# Patient Record
Sex: Male | Born: 1955 | Race: Black or African American | Hispanic: No | Marital: Married | Smoking: Never smoker
Health system: Southern US, Community
[De-identification: ages and names within clinical notes are randomized; demographics above are authoritative.]

## PROBLEM LIST (undated history)

## (undated) ENCOUNTER — Emergency Department (HOSPITAL_COMMUNITY): Admission: EM | Payer: BC Managed Care – PPO

## (undated) DIAGNOSIS — T7840XA Allergy, unspecified, initial encounter: Secondary | ICD-10-CM

## (undated) DIAGNOSIS — Z789 Other specified health status: Secondary | ICD-10-CM

## (undated) DIAGNOSIS — I10 Essential (primary) hypertension: Secondary | ICD-10-CM

## (undated) DIAGNOSIS — Q5520 Unspecified congenital malformations of testis and scrotum: Secondary | ICD-10-CM

## (undated) DIAGNOSIS — N529 Male erectile dysfunction, unspecified: Secondary | ICD-10-CM

## (undated) HISTORY — DX: Essential (primary) hypertension: I10

## (undated) HISTORY — DX: Unspecified congenital malformations of testis and scrotum: Q55.20

## (undated) HISTORY — PX: PICC LINE INSERTION: CATH118290

## (undated) HISTORY — DX: Allergy, unspecified, initial encounter: T78.40XA

## (undated) HISTORY — DX: Male erectile dysfunction, unspecified: N52.9

## (undated) HISTORY — PX: TONSILLECTOMY: SUR1361

---

## 2001-09-11 ENCOUNTER — Encounter: Payer: Self-pay | Admitting: Emergency Medicine

## 2001-09-11 ENCOUNTER — Inpatient Hospital Stay (HOSPITAL_COMMUNITY): Admission: EM | Admit: 2001-09-11 | Discharge: 2001-09-12 | Payer: Self-pay | Admitting: Emergency Medicine

## 2002-04-02 ENCOUNTER — Encounter: Payer: Self-pay | Admitting: Family Medicine

## 2002-04-02 ENCOUNTER — Encounter: Admission: RE | Admit: 2002-04-02 | Discharge: 2002-04-02 | Payer: Self-pay | Admitting: Family Medicine

## 2006-04-24 ENCOUNTER — Ambulatory Visit: Payer: Self-pay | Admitting: Family Medicine

## 2006-05-17 ENCOUNTER — Ambulatory Visit: Payer: Self-pay | Admitting: Gastroenterology

## 2008-06-24 ENCOUNTER — Ambulatory Visit: Payer: Self-pay | Admitting: Family Medicine

## 2009-08-28 ENCOUNTER — Ambulatory Visit: Payer: Self-pay | Admitting: Family Medicine

## 2009-09-14 ENCOUNTER — Ambulatory Visit: Payer: Self-pay | Admitting: Gastroenterology

## 2010-07-29 NOTE — Miscellaneous (Signed)
Summary: [facility] Previsit cx'd/pt.  Clinical Lists Changes     Pt. didn't know when he could get off work for his procedure and didn't know if his wife could get off work to bring him.  In the past he has canceled multiple procedures due to his work schedule.  I advised him to coordinate with his wife and his work so he'd be cleared for  a Friday(Pt. is off every Friday).  Then call and set up his previsit and procedure day.  Pt. verbalized understanding.

## 2010-11-12 NOTE — Op Note (Signed)
[place]. [facility]  Patient:    Aaron Leonard Visit Number: [id number] MRN: [id number]          Service Type: SUR Location: 5000 [id number] [id number] Attending Physician:  Patricia Nettle Dictated by:   Patricia Nettle, M.D. Proc. Date: 09/11/01 Admit Date:  09/11/2001                             Operative Report  DATE OF BIRTH:  10/31/1955  DIAGNOSES: 1. Traumatic left knee arthrotomy. 2. Complete left medial collateral ligament laceration. 3. Grade I open medial femoral condyle fracture.  PROCEDURE: 1. Irrigation and debridement of left open knee arthrotomy and open medial    femoral condyle fracture. 2. Open reduction internal fixation of left medial femoral condyle fracture. 3. Repair of medial collateral ligament laceration. 4. Closure of traumatic wound left knee.  SURGEON:  Patricia Nettle, M.D.  ANESTHESIA:  General endotracheal.  COMPLICATIONS:  None.  INDICATIONS FOR PROCEDURE:  The patient is a 55 year old male who was in his normal state of good health until approximately 2:30 a.m. 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  He suffered a severe laceration with a piece of sheet metal on the conveyor belt, and presented to the [facility] Emergency Department for treatment.  X-rays showed fracture of the medial femoral condyle with air in the joint.  On examination, there was a large 15 cm laceration about the medial joint line.  He had a CT scan which again confirmed the fracture, as well as air in the joint.  He was taken to the operating room for urgent irrigation and debridement and exploration of the wound.  DESCRIPTION OF PROCEDURE:  The patient was properly identified in the holding area as Georgie Chard, and taken to the operating room.  He underwent general endotracheal anesthesia without difficulty.  He was given prophylactic IV antibiotics in the form of Ancef.  He had  previously received a tetanus shot in the emergency department.  The left lower extremity was prepped and draped in the usual sterile fashion.  The laceration was examined.  There was a complete cut through the medial collateral ligament with gross instability to valgus stress testing.  The patella retinaculum had also been lacerated, but the patella itself was not injured.  There was a transverse cut through the medial femoral condyle in a horizontal oblique fashion approximately 1 cm proximal to the articulating articular cartilage.  It was through the non-weightbearing portion of the cartilaginous surface of the medial femoral condyle.  This had created a flap of bone that was displaced proximally, although had a fixed base.  There was no gross contamination noted.  The cuts were cleaned.  At this point, the wound was irrigated with 9 L of sterile saline and 500 cc of antibiotic impregnated solution.  This was done in a pulse lavage fashion, and I was able to look directly into the joint and irrigate all the way across, including the medial, lateral, and suprapatellar compartments.  The medial meniscus was directly visualized and found to be intact.  The weightbearing surface of the medial femoral condyle was intact. The fracture was displaced approximately 1 cm, and I did not feel that the fragment was large enough to accept a screw.  Therefore, I performed a direct repair with #1 PDS suture closing  down the gap.  After reducing and suturing the fracture, we then did a direct repair of the MCL using the #1 interrupted PDS suture in a figure-of-eight fashion.  The patellar retinaculum was then closed with #1 PDS suture.  The deep layers of the subcutaneous tissues were closed using #1 interrupted Vicryl, followed by 2-0 interrupted Vicryl on the superficial subcutaneous layer, and then 3-0 nylon sutures on the skin.  A sterile dressing was applied.  The patient was placed into a knee  immobilizer, extubated without difficulty, and transferred to the recovery room in stable condition. Dictated by:   Patricia Nettle, M.D. Attending Physician:  Patricia Nettle. DD:  09/11/01 TD:  09/12/01 Job: [id number]

## 2010-11-12 NOTE — H&P (Signed)
[place]  Patient:    Aaron Leonard, Aaron Leonard Visit Number: [id number] MRN: [id number]          Service Type: SUR Location: 5000 5003 01 Attending Physician:  Patricia Nettle Dictated by:   Marcie Bal Troncale, P.A.C. Admit Date:  09/11/2001                           History and Physical  DATE OF BIRTH:  [birth year]  CHIEF COMPLAINT:  Left knee injury.  HISTORY OF PRESENT ILLNESS:  The patient is a 55 year old male who sustained an injury to his left knee earlier this morning while at work.  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  While unloading some boxes early this morning, he turned to get a better position and in doing so, his knee got caught on the conveyer belt, twisted and sustained a laceration and injury to his medial aspect of his left knee.  He had no other associated injuries.  He had immediate pain and bleeding from the knee.  He was transported to the [facility] emergency department for further evaluation and treatment.  Dr. Patricia Nettle in orthopedics was consulted.  Patients last p.o. intake was at 12:15 a.m. this morning.  His tetanus shot was updated as well in the emergency department tonight.  REVIEW OF SYSTEMS:  He denies any recent fever or chills.  No headaches, blurry vision or diplopia.  Reports some rhinorrhea secondary to sinus allergies.  No earache or sore throat.  No chest pain, shortness of breath or cough.  No abdominal pain, nausea, vomiting, diarrhea or constipation.  No melena or bright red blood per rectum.  No urinary frequency, hematuria or dysuria.  No numbness or tingling in his extremities.  PAST MEDICAL HISTORY:  He reports good health.  He denies diabetes, hypertension, heart, kidney or ulcer disease.  PAST SURGICAL HISTORY:  Tonsillectomy as a child.  ALLERGIES:  No known drug allergies.  CURRENT MEDICATIONS:  None.  SOCIAL HISTORY:  He works as a Writer.  He is  married and has one child as well as a stepchild.  He denies tobacco or alcohol use. Dr. Meredith Staggers is his primary medical physician, who he saw about two years ago for a routine physical.  PHYSICAL EXAMINATION:  VITALS:  Please see ER record.  GENERAL:  This is a well-developed, well-nourished male in no acute distress.  HEENT:  Head is atraumatic, normocephalic.  Pupils are equal, round and reactive to light and extraocular movements are grossly intact.  Oropharynx is clear without redness, exudate or lesions.  NECK:  Supple with no cervical lymphadenopathy.  CHEST:  Clear to auscultation bilaterally with no wheezes or crackles.  HEART:  Regular rate and rhythm with no murmurs, rubs or gallops.  ABDOMEN:  Soft, nontender, nondistended with no masses.  EXTREMITIES:  Left knee is currently wrapped.  Laceration will be explored in the operating room as well as a clinical exam of the ligamentous structures to the knee.  His distal neurovascular exam is intact, 2+ dorsalis pedis pulse. Motor and sensory are grossly intact in the foot and toes and ankle.  RADIOGRAPHIC STUDIES:  X-rays of the left knee demonstrate a non-displaced medial femoral condyle fracture.  CT scan is currently pending.  IMPRESSION:  Open left knee medial femoral condyle fracture.  PLAN:  Patient will be admitted, kept n.p.o. and  brought to the operating room for irrigation, debridement and exploration of the left knee by Dr. Noel Gerold this morning.  All questions have been encouraged and answered for the patient. Dictated by:   Marcie Bal Troncale, P.A.C. Attending Physician:  Patricia Nettle. DD:  09/11/01 TD:  09/12/01 Job: [phone] [id number]

## 2011-05-17 ENCOUNTER — Encounter: Payer: Self-pay | Admitting: Family Medicine

## 2011-05-23 ENCOUNTER — Encounter: Payer: Self-pay | Admitting: Family Medicine

## 2011-06-06 ENCOUNTER — Encounter: Payer: Self-pay | Admitting: Family Medicine

## 2011-06-06 ENCOUNTER — Ambulatory Visit (INDEPENDENT_AMBULATORY_CARE_PROVIDER_SITE_OTHER): Payer: Managed Care, Other (non HMO) | Admitting: Family Medicine

## 2011-06-06 DIAGNOSIS — N529 Male erectile dysfunction, unspecified: Secondary | ICD-10-CM

## 2011-06-06 DIAGNOSIS — J302 Other seasonal allergic rhinitis: Secondary | ICD-10-CM

## 2011-06-06 DIAGNOSIS — J309 Allergic rhinitis, unspecified: Secondary | ICD-10-CM

## 2011-06-06 DIAGNOSIS — Z0000 Encounter for general adult medical examination without abnormal findings: Secondary | ICD-10-CM

## 2011-06-06 MED ORDER — VARDENAFIL HCL 20 MG PO TABS: 20.0000 mg | ORAL_TABLET | Freq: Every day | ORAL | Status: DC | PRN

## 2011-06-06 NOTE — Patient Instructions (Signed)
Continue to take good care of yourself 

## 2011-06-06 NOTE — Progress Notes (Signed)
  Subjective:    Patient ID: Aaron Leonard, male    DOB: April 28, 1956, 55 y.o.   MRN: [id number]  HPI He is here for complete examination. He is doing quite well. He does have seasonal allergies again. Little difficulty. He would like a refill on his Levitra which she states works quite well. He has no other concerns or complaints. 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 He has never had an exploration of this.   Review of Systems  HENT: Negative.   Eyes: Negative.   Respiratory: Negative.   Cardiovascular: Negative.   Gastrointestinal: Negative.   Genitourinary: Negative.   Musculoskeletal: Negative.   Skin: Negative.   Neurological: Negative.   Hematological: Negative.   Psychiatric/Behavioral: Negative.        Objective:   Physical Exam BP 130/90  Pulse 83  Ht 5\' 9"  (1.753 m)  Wt 186 lb (84.369 kg)  BMI 27.47 kg/m2  General Appearance:    Alert, cooperative, no distress, appears stated age  Head:    Normocephalic, without obvious abnormality, atraumatic  Eyes:    PERRL, conjunctiva/corneas clear, EOM's intact, fundi    benign  Ears:    Normal TM's and external ear canals  Nose:   Nares normal, mucosa normal, no drainage or sinus   tenderness  Throat:   Lips, mucosa, and tongue normal; teeth and gums normal  Neck:   Supple, no lymphadenopathy;  thyroid:  no   enlargement/tenderness/nodules; no carotid   bruit or JVD  Back:    Spine nontender, no curvature, ROM normal, no CVA     tenderness  Lungs:     Clear to auscultation bilaterally without wheezes, rales or     ronchi; respirations unlabored  Chest Wall:    No tenderness or deformity   Heart:    Regular rate and rhythm, S1 and S2 normal, no murmur, rub   or gallop  Breast Exam:    No chest wall tenderness, masses or gynecomastia  Abdomen:     Soft, non-tender, nondistended, normoactive bowel sounds,    no masses, no hepatosplenomegaly  Genitalia:    Normal male external genitalia  without lesions.  Testicle without masses.  No inguinal hernias.  Rectal:   deferred.  Extremities:   No clubbing, cyanosis or edema  Pulses:   2+ and symmetric all extremities  Skin:   Skin color, texture, turgor normal, no rashes or lesions  Lymph nodes:   Cervical, supraclavicular, and axillary nodes normal  Neurologic:   CNII-XII intact, normal strength, sensation and gait; reflexes 2+ and symmetric throughout          Psych:   Normal mood, affect, hygiene and grooming.           Assessment & Plan:   1. Routine general medical examination at a health care facility  HM COLONOSCOPY, PSA, CBC with Differential, Comprehensive metabolic panel, Lipid panel  2. ED (erectile dysfunction)    3. Allergic rhinitis, seasonal    4 absence of one testing Flu shot given with discussion of possible side effects. Levitra was also renewed.

## 2011-06-07 DIAGNOSIS — Z23 Encounter for immunization: Secondary | ICD-10-CM

## 2011-06-07 LAB — CBC WITH DIFFERENTIAL/PLATELET
Basophils Absolute: 0 10*3/uL (ref 0.0–0.1)
Basophils Relative: 0 % (ref 0–1)
Eosinophils Absolute: 0.1 10*3/uL (ref 0.0–0.7)
Eosinophils Relative: 1 % (ref 0–5)
HCT: 46.7 % (ref 39.0–52.0)
Hemoglobin: 15.1 g/dL (ref 13.0–17.0)
Lymphocytes Relative: 32 % (ref 12–46)
Lymphs Abs: 2.7 10*3/uL (ref 0.7–4.0)
MCH: 28.7 pg (ref 26.0–34.0)
MCHC: 32.3 g/dL (ref 30.0–36.0)
MCV: 88.6 fL (ref 78.0–100.0)
Monocytes Absolute: 0.6 10*3/uL (ref 0.1–1.0)
Monocytes Relative: 7 % (ref 3–12)
Neutro Abs: 5.1 10*3/uL (ref 1.7–7.7)
Neutrophils Relative %: 60 % (ref 43–77)
Platelets: 322 10*3/uL (ref 150–400)
RBC: 5.27 MIL/uL (ref 4.22–5.81)
RDW: 13.2 % (ref 11.5–15.5)
WBC: 8.5 10*3/uL (ref 4.0–10.5)

## 2011-06-07 LAB — COMPREHENSIVE METABOLIC PANEL
ALT: 39 U/L (ref 0–53)
AST: 42 U/L — ABNORMAL HIGH (ref 0–37)
Albumin: 4.9 g/dL (ref 3.5–5.2)
Alkaline Phosphatase: 64 U/L (ref 39–117)
BUN: 17 mg/dL (ref 6–23)
CO2: 24 mEq/L (ref 19–32)
Calcium: 9.9 mg/dL (ref 8.4–10.5)
Chloride: 104 mEq/L (ref 96–112)
Creat: 0.74 mg/dL (ref 0.50–1.35)
Glucose, Bld: 79 mg/dL (ref 70–99)
Potassium: 4.2 mEq/L (ref 3.5–5.3)
Sodium: 141 mEq/L (ref 135–145)
Total Bilirubin: 0.8 mg/dL (ref 0.3–1.2)
Total Protein: 8 g/dL (ref 6.0–8.3)

## 2011-06-07 LAB — LIPID PANEL
HDL: 54 mg/dL (ref >39)
LDL Cholesterol: 62 mg/dL (ref 0–99)
Total CHOL/HDL Ratio: 2.3 Ratio
VLDL: 8 mg/dL (ref 0–40)

## 2011-06-07 LAB — PSA: PSA: 3.13 ng/mL (ref <=4.00)

## 2011-07-04 ENCOUNTER — Encounter: Payer: Managed Care, Other (non HMO) | Admitting: Gastroenterology

## 2013-06-07 ENCOUNTER — Encounter: Payer: Self-pay | Admitting: Family Medicine

## 2013-06-07 ENCOUNTER — Ambulatory Visit (INDEPENDENT_AMBULATORY_CARE_PROVIDER_SITE_OTHER): Payer: Managed Care, Other (non HMO) | Admitting: Family Medicine

## 2013-06-07 VITALS — BP 130/90 | HR 109 | Temp 99.0°F | Wt 180.0 lb

## 2013-06-07 DIAGNOSIS — R1011 Right upper quadrant pain: Secondary | ICD-10-CM

## 2013-06-07 LAB — CBC WITH DIFFERENTIAL/PLATELET
Basophils Absolute: 0 10*3/uL (ref 0.0–0.1)
Basophils Relative: 0 % (ref 0–1)
Lymphocytes Relative: 10 % — ABNORMAL LOW (ref 12–46)
MCHC: 33.8 g/dL (ref 30.0–36.0)
Neutro Abs: 14.1 10*3/uL — ABNORMAL HIGH (ref 1.7–7.7)
Neutrophils Relative %: 81 % — ABNORMAL HIGH (ref 43–77)
Platelets: 386 10*3/uL (ref 150–400)
RDW: 12.7 % (ref 11.5–15.5)
WBC: 17.4 10*3/uL — ABNORMAL HIGH (ref 4.0–10.5)

## 2013-06-07 LAB — COMPREHENSIVE METABOLIC PANEL
ALT: 41 U/L (ref 0–53)
AST: 44 U/L — ABNORMAL HIGH (ref 0–37)
Albumin: 3.6 g/dL (ref 3.5–5.2)
Alkaline Phosphatase: 107 U/L (ref 39–117)
Calcium: 9.2 mg/dL (ref 8.4–10.5)
Chloride: 97 mEq/L (ref 96–112)
Potassium: 4.6 mEq/L (ref 3.5–5.3)

## 2013-06-07 NOTE — Progress Notes (Signed)
Teaching Physician: Sharlot Gowda, MD Dictated By: Judithann Graves  Subjective:  HERLEY BERNARDINI is a 57 y.o. male who presents for nagging midepigastric pain that occasionally extends to the RUQ. This episode began on Thanksgiving night (2 weeks ago). His midepigastric pain been present constantly since then. Laughing, coughing, doubling over at his waist exacerbate his pain. Has been feeling a little more tired over the last 2 weeks. Has had decreased appetite since this began. Subjective fever - possibly to 16 F - began one week ago. His pain is not affected by food. Perhaps a gnawing pain, not a crampy or burning pain. No association with food, radiation to back or shoulder, CP, or SOB. Has had decreased number of stools, and looser stools that are probably lighter in color than usual (now light brown). No melena, hematochezia, mucoid-appearing stools, nausea, vomiting. Never smoker, no alcohol consumption. Has taken 7 Aleve tablets over the last two weeks. Reports that he has never had colonoscopy.   ROS negative except as in subjective.  Objective: Filed Vitals:   06/07/13 1058  BP: 130/90  Pulse: 109  Temp: 99 F (37.2 C)    Physical Exam:  General: Alert and in no distress  HEENT: Tympanic membranes and canals are normal. Throat is clear. Tonsils are normal. No scleral icterus. Neck: Supple without adenopathy or thyromegaly CV: Regular sinus rhythm without murmurs or gallops  Pulm: Lungs are clear to auscultation  Abd: Soft, tender to palpation over RUQ. No guarding or rebound. NABS.  Assessment and Plan: 1. Abdominal pain, right upper quadrant Will obtain US to evaluate for structural cause, LFTs to evaluate for possible hepatic pathology, CBC to establish whether experiencing inflammatory process. - CBC with Differential - Comprehensive metabolic panel    Dr. Susann Givens was present for the encounter and agrees with the above assessment and plan.

## 2013-06-10 ENCOUNTER — Telehealth: Payer: Self-pay

## 2013-06-10 NOTE — Telephone Encounter (Signed)
I called and talked with mrs.saunders and asked how the pt is doing and she said he was still in some pain but ok and I asked if he had eaten yet and she said yes so to get u/s done today there is no way so pt will have to go tomorrow morning

## 2013-06-11 ENCOUNTER — Other Ambulatory Visit: Payer: Self-pay

## 2013-06-11 ENCOUNTER — Other Ambulatory Visit: Payer: Self-pay | Admitting: Family Medicine

## 2013-06-11 ENCOUNTER — Other Ambulatory Visit: Payer: Managed Care, Other (non HMO)

## 2013-06-11 ENCOUNTER — Ambulatory Visit
Admission: RE | Admit: 2013-06-11 | Discharge: 2013-06-11 | Disposition: A | Discharge status: Home / Self Care | Payer: Managed Care, Other (non HMO) | Source: Ambulatory Visit | Attending: Family Medicine | Admitting: Family Medicine

## 2013-06-11 ENCOUNTER — Telehealth: Payer: Self-pay

## 2013-06-11 DIAGNOSIS — R1011 Right upper quadrant pain: Secondary | ICD-10-CM

## 2013-06-11 DIAGNOSIS — R935 Abnormal findings on diagnostic imaging of other abdominal regions, including retroperitoneum: Secondary | ICD-10-CM

## 2013-06-11 NOTE — Telephone Encounter (Signed)
CALLED PT LEFT MESSAGE TO CALL ME BACK HE HAS A CT SCHEDULED Friday DEC.19 TH AT [address] [facility] HE NEEDS TO ARRIVE AT 11:15 FOR 11:30 NOTHING TO EAT SOLID 4 HRS PRIOR ( HE NEEDS TO GO BY [facility] AND PICK UP CONTRAST)

## 2013-06-11 NOTE — Progress Notes (Signed)
I HAVE CALLED AND LEFT PT MESSAGE TO CALL ME BACK

## 2013-06-14 ENCOUNTER — Other Ambulatory Visit: Payer: Self-pay

## 2013-06-14 ENCOUNTER — Encounter (HOSPITAL_COMMUNITY): Payer: Self-pay | Admitting: Pharmacy Technician

## 2013-06-14 ENCOUNTER — Telehealth: Payer: Self-pay | Admitting: Family Medicine

## 2013-06-14 ENCOUNTER — Ambulatory Visit
Admission: RE | Admit: 2013-06-14 | Discharge: 2013-06-14 | Disposition: A | Discharge status: Home / Self Care | Payer: Managed Care, Other (non HMO) | Source: Ambulatory Visit | Attending: Family Medicine | Admitting: Family Medicine

## 2013-06-14 DIAGNOSIS — K750 Abscess of liver: Secondary | ICD-10-CM

## 2013-06-14 DIAGNOSIS — R935 Abnormal findings on diagnostic imaging of other abdominal regions, including retroperitoneum: Secondary | ICD-10-CM

## 2013-06-14 MED ORDER — IOHEXOL 300 MG/ML  SOLN
100.0000 mL | Freq: Once | INTRAMUSCULAR | Status: AC | PRN
  Administered 2013-06-14: 100 mL via INTRAVENOUS

## 2013-06-14 NOTE — Telephone Encounter (Signed)
Let him know that we have to wait till Monday

## 2013-06-14 NOTE — Telephone Encounter (Signed)
PT INFORMED WE HAVE TO WAIT TILL Monday TO GET ANYTHING DONE PT VERBALIZED UNDERSTANDING

## 2013-06-15 ENCOUNTER — Other Ambulatory Visit: Payer: Self-pay | Admitting: Radiology

## 2013-06-17 ENCOUNTER — Ambulatory Visit (HOSPITAL_COMMUNITY)
Admission: RE | Admit: 2013-06-17 | Discharge: 2013-06-17 | Disposition: A | Discharge status: Home / Self Care | Payer: Managed Care, Other (non HMO) | Source: Ambulatory Visit | Attending: Family Medicine | Admitting: Family Medicine

## 2013-06-17 ENCOUNTER — Other Ambulatory Visit: Payer: Self-pay | Admitting: Family Medicine

## 2013-06-17 ENCOUNTER — Other Ambulatory Visit (HOSPITAL_COMMUNITY): Payer: Managed Care, Other (non HMO)

## 2013-06-17 DIAGNOSIS — K750 Abscess of liver: Secondary | ICD-10-CM | POA: Insufficient documentation

## 2013-06-17 DIAGNOSIS — K7689 Other specified diseases of liver: Secondary | ICD-10-CM | POA: Insufficient documentation

## 2013-06-17 LAB — CBC
MCH: 29.7 pg (ref 26.0–34.0)
MCHC: 34.1 g/dL (ref 30.0–36.0)
Platelets: 396 10*3/uL (ref 150–400)
RBC: 4.75 MIL/uL (ref 4.22–5.81)
WBC: 19.6 10*3/uL — ABNORMAL HIGH (ref 4.0–10.5)

## 2013-06-17 LAB — APTT: aPTT: 28 seconds (ref 24–37)

## 2013-06-17 LAB — PROTIME-INR
INR: 1.23 (ref 0.00–1.49)
Prothrombin Time: 15.2 seconds (ref 11.6–15.2)

## 2013-06-17 MED ORDER — FENTANYL CITRATE 0.05 MG/ML IJ SOLN
INTRAMUSCULAR | Status: AC
  Filled 2013-06-17: qty 2

## 2013-06-17 MED ORDER — MIDAZOLAM HCL 2 MG/2ML IJ SOLN
INTRAMUSCULAR | Status: DC | PRN
  Administered 2013-06-17: 1 mg via INTRAVENOUS

## 2013-06-17 MED ORDER — MIDAZOLAM HCL 2 MG/2ML IJ SOLN
INTRAMUSCULAR | Status: AC
  Filled 2013-06-17: qty 2

## 2013-06-17 MED ORDER — LIDOCAINE HCL 1 % IJ SOLN
INTRAMUSCULAR | Status: AC
  Filled 2013-06-17: qty 10

## 2013-06-17 MED ORDER — HYDROCODONE-ACETAMINOPHEN 5-325 MG PO TABS: 1.0000 | ORAL_TABLET | ORAL | Status: DC | PRN

## 2013-06-17 MED ORDER — SODIUM CHLORIDE 0.9 % IV SOLN
INTRAVENOUS | Status: DC
  Administered 2013-06-17: 10:00:00 via INTRAVENOUS

## 2013-06-17 MED ORDER — FENTANYL CITRATE 0.05 MG/ML IJ SOLN
INTRAMUSCULAR | Status: DC | PRN
  Administered 2013-06-17: 50 ug via INTRAVENOUS

## 2013-06-17 NOTE — Procedures (Signed)
CT aspiration liver abscess 80ml purulent material, to micro and cyto No complication No blood loss. See complete dictation in [facility].

## 2013-06-17 NOTE — H&P (Signed)
Aaron Leonard is an 57 y.o. male.   Chief Complaint: "Aaron'm having a procedure on my liver" BJY:NWGN Aaron Leonard is a 57 y.o. male with history of nagging midepigastric pain that occasionally extends to the RUQ. This episode began on Thanksgiving night (2 weeks ago). His midepigastric pain been present constantly since then. Laughing, coughing, doubling over at his waist exacerbate his pain. Has been feeling a little more tired over the last 2 weeks. Has had decreased appetite since this began. Subjective fever - possibly to 77 F - began one week ago. His pain is not affected by food. Perhaps a gnawing pain, not a crampy or burning pain. No association with food, radiation to back or shoulder, CP, or SOB. Has had decreased number of stools, and looser stools that are probably lighter in color than usual (now light brown). No melena, hematochezia, mucoid-appearing stools, nausea, vomiting. Never smoker, no alcohol consumption. Has taken 7 Aleve tablets over the last two weeks. Reports that he has never had colonoscopy. CT A/P on 06/14/13 revealed a dominant multiseptated cystic mass within left lobe liver suspicious for abscess in addition to smaller liver lesions of indeterminate etiology. Pt denies hx of malignancy. He presents today for CT guided aspiration/possible drainage of dominant cystic mass/?abscess.    Past Medical History  Diagnosis Date  . Allergy     RHINITIS  . ED (erectile dysfunction)   . Congenital anomalies of the testes     No past surgical history on file.  No family history on file. Social History:  reports that he has never smoked. He has never used smokeless tobacco. He reports that he does not drink alcohol or use illicit drugs.  Allergies: No Known Allergies  Current outpatient prescriptions:vardenafil (LEVITRA) 20 MG tablet, Take 1 tablet (20 mg total) by mouth daily as needed., Disp: 10 tablet, Rfl: 11 Current facility-administered medications:0.9 %  sodium chloride  infusion, , Intravenous, Continuous, Aaron Jeananne Rama, PA-C, Last Rate: 20 mL/hr at 06/17/13 1000   Results for orders placed during the hospital encounter of 06/17/13 (from the past 48 hour(s))  CBC     Status: Abnormal   Collection Time    06/17/13 10:03 AM      Result Value Range   WBC 19.6 (*) 4.0 - 10.5 K/uL   RBC 4.75  4.22 - 5.81 MIL/uL   Hemoglobin 14.1  13.0 - 17.0 g/dL   HCT 56.2  13.0 - 86.5 %   MCV 86.9  78.0 - 100.0 fL   MCH 29.7  26.0 - 34.0 pg   MCHC 34.1  30.0 - 36.0 g/dL   RDW 78.4  69.6 - 29.5 %   Platelets 396  150 - 400 K/uL  06/17/13   PT 15.2  INR 1.23 PTT 28 No results found.  Review of Systems  Constitutional: Positive for fever. Negative for chills.  Respiratory: Negative for cough and shortness of breath.   Cardiovascular: Negative for chest pain.  Gastrointestinal: Positive for abdominal pain. Negative for nausea, vomiting and blood in stool.  Genitourinary: Negative for dysuria and hematuria.  Musculoskeletal: Negative for back pain.  Neurological: Negative for headaches.  Endo/Heme/Allergies: Does not bruise/bleed easily.    Blood pressure 138/82, pulse 99, temperature 98.1 F (36.7 C), temperature source Oral, resp. rate 20, height 5' 9.5" (1.765 m), weight 180 lb (81.647 kg), SpO2 100.00%. Physical Exam  Constitutional: He is oriented to person, place, and time. He appears well-developed and well-nourished.  Cardiovascular: Normal rate and  regular rhythm.   Respiratory: Effort normal and breath sounds normal.  GI: Bowel sounds are normal. He exhibits no distension.  Mild epigastric/RUQ tenderness  Musculoskeletal: Normal range of motion. He exhibits no edema.  Neurological: He is alert and oriented to person, place, and time.     Assessment/Plan Pt with hx of recent fevers, leukocytosis, abdominal pain and CT A/P on 06/14/13 revealing dominant multiseptated cystic mass within left lobe liver suspicious for abscess in addition to smaller liver  lesions of indeterminate etiology. Pt denies hx of malignancy. He presents today for CT guided aspiration/possible drainage of dominant cystic mass/?abscess. Details/risks of procedure Aaron/w pt/wife with their understanding and consent.  Aaron Leonard,Aaron Leonard 06/17/2013, 10:33 AM

## 2013-06-19 MED ORDER — AMOXICILLIN-POT CLAVULANATE 875-125 MG PO TABS: 1.0000 | ORAL_TABLET | Freq: Two times a day (BID) | ORAL | Status: DC

## 2013-06-19 NOTE — Progress Notes (Signed)
The liver abscess did show evidence of gram-positive cocci. This was discussed with Dr. Orvan Falconer. I will place him on Augmentin and recheck in roughly 10 days and order a CT scan.

## 2013-06-23 LAB — CULTURE, ROUTINE-ABSCESS

## 2013-07-01 ENCOUNTER — Encounter: Payer: Self-pay | Admitting: Family Medicine

## 2013-07-01 ENCOUNTER — Telehealth: Payer: Self-pay | Admitting: Internal Medicine

## 2013-07-01 ENCOUNTER — Ambulatory Visit: Payer: Managed Care, Other (non HMO) | Admitting: Family Medicine

## 2013-07-01 ENCOUNTER — Ambulatory Visit (INDEPENDENT_AMBULATORY_CARE_PROVIDER_SITE_OTHER): Payer: BC Managed Care – PPO | Admitting: Family Medicine

## 2013-07-01 VITALS — BP 124/80 | HR 70 | Wt 169.0 lb

## 2013-07-01 DIAGNOSIS — K750 Abscess of liver: Secondary | ICD-10-CM

## 2013-07-01 LAB — CBC WITH DIFFERENTIAL/PLATELET
Basophils Absolute: 0.1 10*3/uL (ref 0.0–0.1)
Basophils Relative: 1 % (ref 0–1)
EOS ABS: 0.1 10*3/uL (ref 0.0–0.7)
EOS PCT: 1 % (ref 0–5)
HEMATOCRIT: 34.7 % — AB (ref 39.0–52.0)
Hemoglobin: 11.5 g/dL — ABNORMAL LOW (ref 13.0–17.0)
LYMPHS PCT: 20 % (ref 12–46)
Lymphs Abs: 2 10*3/uL (ref 0.7–4.0)
MCH: 28.1 pg (ref 26.0–34.0)
MCHC: 33.1 g/dL (ref 30.0–36.0)
MCV: 84.8 fL (ref 78.0–100.0)
MONO ABS: 0.9 10*3/uL (ref 0.1–1.0)
Monocytes Relative: 9 % (ref 3–12)
Neutro Abs: 7.2 10*3/uL (ref 1.7–7.7)
Neutrophils Relative %: 69 % (ref 43–77)
Platelets: 430 10*3/uL — ABNORMAL HIGH (ref 150–400)
RBC: 4.09 MIL/uL — AB (ref 4.22–5.81)
RDW: 13.5 % (ref 11.5–15.5)
WBC: 10.3 10*3/uL (ref 4.0–10.5)

## 2013-07-01 NOTE — Telephone Encounter (Signed)
No prior authorization needed for outpatient imaging.

## 2013-07-01 NOTE — Progress Notes (Signed)
   Subjective:    Patient ID: Aaron Leonard, male    DOB: 06/09/1956, 58 y.o.   MRN: [id number]  HPI He is here for recheck. He had 80 cc of fluid removed from the abscess and has noted an improvement in his overall feeling. He did note some fatigue that did improve once he started an antibiotic. He also noted an improvement in the right upper water fullness sensation . Review his record indicates that a Peptostreptococcus was grown. He was placed on Augmentin.  Review of Systems     Objective:   Physical Exam Alert and in no distress. Abdominal exam shows no hepatomegaly.       Assessment & Plan:  Liver abscess - Plan: CBC with Differential, CT Abd Wo & W Cm

## 2013-07-02 ENCOUNTER — Encounter: Payer: Managed Care, Other (non HMO) | Admitting: Family Medicine

## 2013-07-04 ENCOUNTER — Other Ambulatory Visit: Payer: Managed Care, Other (non HMO)

## 2013-07-05 ENCOUNTER — Other Ambulatory Visit: Payer: Managed Care, Other (non HMO)

## 2013-07-29 ENCOUNTER — Ambulatory Visit: Payer: Managed Care, Other (non HMO) | Admitting: Family Medicine

## 2013-08-02 ENCOUNTER — Other Ambulatory Visit: Payer: Self-pay

## 2013-08-05 ENCOUNTER — Encounter: Payer: Self-pay | Admitting: Family Medicine

## 2013-08-05 ENCOUNTER — Telehealth: Payer: Self-pay | Admitting: Internal Medicine

## 2013-08-05 ENCOUNTER — Ambulatory Visit (INDEPENDENT_AMBULATORY_CARE_PROVIDER_SITE_OTHER): Payer: BC Managed Care – PPO | Admitting: Family Medicine

## 2013-08-05 VITALS — BP 118/80 | HR 60 | Wt 157.0 lb

## 2013-08-05 DIAGNOSIS — K750 Abscess of liver: Secondary | ICD-10-CM

## 2013-08-05 LAB — COMPREHENSIVE METABOLIC PANEL
ALK PHOS: 103 U/L (ref 39–117)
ALT: 32 U/L (ref 0–53)
AST: 44 U/L — ABNORMAL HIGH (ref 0–37)
Albumin: 3.1 g/dL — ABNORMAL LOW (ref 3.5–5.2)
BUN: 9 mg/dL (ref 6–23)
CO2: 26 mEq/L (ref 19–32)
CREATININE: 0.63 mg/dL (ref 0.50–1.35)
Calcium: 8.8 mg/dL (ref 8.4–10.5)
Chloride: 100 mEq/L (ref 96–112)
Glucose, Bld: 95 mg/dL (ref 70–99)
POTASSIUM: 4.1 meq/L (ref 3.5–5.3)
Sodium: 133 mEq/L — ABNORMAL LOW (ref 135–145)
Total Bilirubin: 0.6 mg/dL (ref 0.2–1.2)
Total Protein: 7.3 g/dL (ref 6.0–8.3)

## 2013-08-05 LAB — CBC WITH DIFFERENTIAL/PLATELET
BASOS PCT: 0 % (ref 0–1)
Basophils Absolute: 0 10*3/uL (ref 0.0–0.1)
EOS PCT: 0 % (ref 0–5)
Eosinophils Absolute: 0 10*3/uL (ref 0.0–0.7)
HCT: 30.9 % — ABNORMAL LOW (ref 39.0–52.0)
HEMOGLOBIN: 10.4 g/dL — AB (ref 13.0–17.0)
Lymphocytes Relative: 16 % (ref 12–46)
Lymphs Abs: 2.2 10*3/uL (ref 0.7–4.0)
MCH: 27.2 pg (ref 26.0–34.0)
MCHC: 33.7 g/dL (ref 30.0–36.0)
MCV: 80.9 fL (ref 78.0–100.0)
MONO ABS: 1.3 10*3/uL — AB (ref 0.1–1.0)
MONOS PCT: 9 % (ref 3–12)
Neutro Abs: 10.5 10*3/uL — ABNORMAL HIGH (ref 1.7–7.7)
Neutrophils Relative %: 75 % (ref 43–77)
Platelets: 504 10*3/uL — ABNORMAL HIGH (ref 150–400)
RBC: 3.82 MIL/uL — ABNORMAL LOW (ref 4.22–5.81)
RDW: 14.3 % (ref 11.5–15.5)
WBC: 14 10*3/uL — ABNORMAL HIGH (ref 4.0–10.5)

## 2013-08-05 NOTE — Telephone Encounter (Signed)
Pt scheduled for CT adbominal this Friday 2/13 @ 12:15pm. Insurance approved it from 2/9-3/10. Auth # [id number]

## 2013-08-05 NOTE — Progress Notes (Signed)
   Subjective:    Patient ID: Aaron Leonard, male    DOB: 06/23/1956, 58 y.o.   MRN: [id number]  HPI He is here for recheck. He was treated in late December for liver abscess and a followup CT scan was recommended however he did not get this arranged. He continues to have right upper quadrant discomfort. No fever, chills, nausea or vomiting.   Review of Systems     Objective:   Physical Exam Alert and in no distress. Abdominal exam shows decreased bowel sounds. Questionable liver edge is present. The lower ribs were difficult to feel on the right.       Assessment & Plan:  Liver abscess - Plan: CBC with Differential, Comprehensive metabolic panel  CT scan was placed as a future order and will now be ordered.

## 2013-08-09 ENCOUNTER — Ambulatory Visit
Admission: RE | Admit: 2013-08-09 | Discharge: 2013-08-09 | Disposition: A | Discharge status: Home / Self Care | Payer: BC Managed Care – PPO | Source: Ambulatory Visit | Attending: Family Medicine | Admitting: Family Medicine

## 2013-08-09 ENCOUNTER — Inpatient Hospital Stay (HOSPITAL_COMMUNITY)
Admission: EM | Admit: 2013-08-09 | Discharge: 2013-08-12 | DRG: 442 | Disposition: A | Discharge status: Home Health | Payer: BC Managed Care – PPO | Attending: Internal Medicine | Admitting: Internal Medicine

## 2013-08-09 ENCOUNTER — Encounter (HOSPITAL_COMMUNITY): Payer: Self-pay | Admitting: Emergency Medicine

## 2013-08-09 ENCOUNTER — Inpatient Hospital Stay (HOSPITAL_COMMUNITY): Payer: BC Managed Care – PPO

## 2013-08-09 DIAGNOSIS — R7989 Other specified abnormal findings of blood chemistry: Secondary | ICD-10-CM | POA: Diagnosis present

## 2013-08-09 DIAGNOSIS — R000 Tachycardia, unspecified: Secondary | ICD-10-CM | POA: Diagnosis present

## 2013-08-09 DIAGNOSIS — R945 Abnormal results of liver function studies: Secondary | ICD-10-CM

## 2013-08-09 DIAGNOSIS — D638 Anemia in other chronic diseases classified elsewhere: Secondary | ICD-10-CM | POA: Diagnosis present

## 2013-08-09 DIAGNOSIS — D649 Anemia, unspecified: Secondary | ICD-10-CM | POA: Diagnosis present

## 2013-08-09 DIAGNOSIS — D72829 Elevated white blood cell count, unspecified: Secondary | ICD-10-CM | POA: Diagnosis present

## 2013-08-09 DIAGNOSIS — K750 Abscess of liver: Principal | ICD-10-CM | POA: Diagnosis present

## 2013-08-09 DIAGNOSIS — E871 Hypo-osmolality and hyponatremia: Secondary | ICD-10-CM | POA: Diagnosis present

## 2013-08-09 HISTORY — DX: Other specified health status: Z78.9

## 2013-08-09 LAB — POCT I-STAT, CHEM 8
BUN: 9 mg/dL (ref 6–23)
CHLORIDE: 98 meq/L (ref 96–112)
Calcium, Ion: 1.24 mmol/L — ABNORMAL HIGH (ref 1.12–1.23)
Creatinine, Ser: 0.6 mg/dL (ref 0.50–1.35)
Glucose, Bld: 88 mg/dL (ref 70–99)
HCT: 37 % — ABNORMAL LOW (ref 39.0–52.0)
Hemoglobin: 12.6 g/dL — ABNORMAL LOW (ref 13.0–17.0)
Potassium: 3.8 mEq/L (ref 3.7–5.3)
Sodium: 137 mEq/L (ref 137–147)
TCO2: 26 mmol/L (ref 0–100)

## 2013-08-09 LAB — CBC WITH DIFFERENTIAL/PLATELET
Basophils Absolute: 0 10*3/uL (ref 0.0–0.1)
Basophils Absolute: 0 10*3/uL (ref 0.0–0.1)
Basophils Relative: 0 % (ref 0–1)
Basophils Relative: 0 % (ref 0–1)
EOS ABS: 0.1 10*3/uL (ref 0.0–0.7)
EOS PCT: 0 % (ref 0–5)
Eosinophils Absolute: 0 10*3/uL (ref 0.0–0.7)
Eosinophils Relative: 0 % (ref 0–5)
HCT: 32.7 % — ABNORMAL LOW (ref 39.0–52.0)
HCT: 30.5 % — ABNORMAL LOW (ref 39.0–52.0)
HEMOGLOBIN: 10.9 g/dL — AB (ref 13.0–17.0)
Hemoglobin: 10.1 g/dL — ABNORMAL LOW (ref 13.0–17.0)
LYMPHS ABS: 1.7 10*3/uL (ref 0.7–4.0)
LYMPHS ABS: 1.2 10*3/uL (ref 0.7–4.0)
LYMPHS PCT: 10 % — AB (ref 12–46)
Lymphocytes Relative: 12 % (ref 12–46)
MCH: 28.2 pg (ref 26.0–34.0)
MCH: 27.8 pg (ref 26.0–34.0)
MCHC: 33.3 g/dL (ref 30.0–36.0)
MCHC: 33.1 g/dL (ref 30.0–36.0)
MCV: 84.7 fL (ref 78.0–100.0)
MCV: 84 fL (ref 78.0–100.0)
MONOS PCT: 11 % (ref 3–12)
MONOS PCT: 11 % (ref 3–12)
Monocytes Absolute: 1.5 10*3/uL — ABNORMAL HIGH (ref 0.1–1.0)
Monocytes Absolute: 1.3 10*3/uL — ABNORMAL HIGH (ref 0.1–1.0)
NEUTROS ABS: 10.7 10*3/uL — AB (ref 1.7–7.7)
Neutro Abs: 9.5 10*3/uL — ABNORMAL HIGH (ref 1.7–7.7)
Neutrophils Relative %: 76 % (ref 43–77)
Neutrophils Relative %: 79 % — ABNORMAL HIGH (ref 43–77)
PLATELETS: 389 10*3/uL (ref 150–400)
Platelets: 457 10*3/uL — ABNORMAL HIGH (ref 150–400)
RBC: 3.86 MIL/uL — AB (ref 4.22–5.81)
RBC: 3.63 MIL/uL — AB (ref 4.22–5.81)
RDW: 14.6 % (ref 11.5–15.5)
RDW: 14.3 % (ref 11.5–15.5)
WBC: 14 10*3/uL — ABNORMAL HIGH (ref 4.0–10.5)
WBC: 12.1 10*3/uL — AB (ref 4.0–10.5)

## 2013-08-09 LAB — COMPREHENSIVE METABOLIC PANEL
ALT: 45 U/L (ref 0–53)
AST: 62 U/L — ABNORMAL HIGH (ref 0–37)
Albumin: 3 g/dL — ABNORMAL LOW (ref 3.5–5.2)
Alkaline Phosphatase: 159 U/L — ABNORMAL HIGH (ref 39–117)
BUN: 11 mg/dL (ref 6–23)
CALCIUM: 9.3 mg/dL (ref 8.4–10.5)
CO2: 26 mEq/L (ref 19–32)
CREATININE: 0.59 mg/dL (ref 0.50–1.35)
Chloride: 98 mEq/L (ref 96–112)
GFR calc Af Amer: >90 mL/min (ref >90)
Glucose, Bld: 87 mg/dL (ref 70–99)
Potassium: 4 mEq/L (ref 3.7–5.3)
Sodium: 136 mEq/L — ABNORMAL LOW (ref 137–147)
TOTAL PROTEIN: 8.8 g/dL — AB (ref 6.0–8.3)
Total Bilirubin: 0.6 mg/dL (ref 0.3–1.2)

## 2013-08-09 LAB — PROTIME-INR
INR: 1.06 (ref 0.00–1.49)
Prothrombin Time: 13.6 seconds (ref 11.6–15.2)

## 2013-08-09 MED ORDER — ACETAMINOPHEN 325 MG PO TABS: 650.0000 mg | ORAL_TABLET | Freq: Four times a day (QID) | ORAL | Status: DC | PRN

## 2013-08-09 MED ORDER — ONDANSETRON HCL 4 MG/2ML IJ SOLN: 4.0000 mg | Freq: Four times a day (QID) | INTRAMUSCULAR | Status: DC | PRN

## 2013-08-09 MED ORDER — CIPROFLOXACIN IN D5W 400 MG/200ML IV SOLN
400.0000 mg | Freq: Two times a day (BID) | INTRAVENOUS | Status: DC
  Administered 2013-08-09 – 2013-08-12 (×6): 400 mg via INTRAVENOUS
  Filled 2013-08-09 (×8): qty 200

## 2013-08-09 MED ORDER — METRONIDAZOLE IN NACL 5-0.79 MG/ML-% IV SOLN
500.0000 mg | Freq: Once | INTRAVENOUS | Status: AC
  Administered 2013-08-09: 500 mg via INTRAVENOUS
  Filled 2013-08-09: qty 100

## 2013-08-09 MED ORDER — MORPHINE SULFATE 2 MG/ML IJ SOLN: 1.0000 mg | INTRAMUSCULAR | Status: DC | PRN

## 2013-08-09 MED ORDER — METRONIDAZOLE IN NACL 5-0.79 MG/ML-% IV SOLN
500.0000 mg | Freq: Three times a day (TID) | INTRAVENOUS | Status: DC
  Administered 2013-08-09 – 2013-08-12 (×8): 500 mg via INTRAVENOUS
  Filled 2013-08-09 (×10): qty 100

## 2013-08-09 MED ORDER — CIPROFLOXACIN IN D5W 400 MG/200ML IV SOLN
400.0000 mg | Freq: Once | INTRAVENOUS | Status: DC
  Filled 2013-08-09: qty 200

## 2013-08-09 MED ORDER — IOHEXOL 300 MG/ML  SOLN
100.0000 mL | Freq: Once | INTRAMUSCULAR | Status: AC | PRN
  Administered 2013-08-09: 100 mL via INTRAVENOUS

## 2013-08-09 MED ORDER — ACETAMINOPHEN 650 MG RE SUPP: 650.0000 mg | Freq: Four times a day (QID) | RECTAL | Status: DC | PRN

## 2013-08-09 MED ORDER — SODIUM CHLORIDE 0.9 % IV SOLN
INTRAVENOUS | Status: AC
  Administered 2013-08-09: 23:00:00 via INTRAVENOUS

## 2013-08-09 MED ORDER — ONDANSETRON HCL 4 MG PO TABS: 4.0000 mg | ORAL_TABLET | Freq: Four times a day (QID) | ORAL | Status: DC | PRN

## 2013-08-09 MED ORDER — HYDROCODONE-ACETAMINOPHEN 5-325 MG PO TABS
1.0000 | ORAL_TABLET | ORAL | Status: DC | PRN
  Administered 2013-08-11 (×2): 1 via ORAL
  Filled 2013-08-09: qty 1
  Filled 2013-08-09: qty 2

## 2013-08-09 NOTE — ED Notes (Signed)
Pt in after CT scan, was told to come here to follow up on abnormal test results, CT shows worsening liver abscess, pt denies pain at this time, no distress noted

## 2013-08-09 NOTE — ED Provider Notes (Signed)
Medical screening examination/treatment/procedure(s) were conducted as a shared visit with non-physician practitioner(s) and myself.  I personally evaluated the patient during the encounter.  EKG Interpretation   None       Pt is a 58 y.o. M with a history of prior liver abscess status post drainage in December 2 14 who presented emergency department with persistent right upper quadrant pain and a CT scan was performed today as an outpatient that showed worsening size of his liver abscess. She was sent here to have drainage by interventional radiology. Discussed with radiology but they are unable to perform this procedure tonight. Will admit and give IV Cipro and Flagyl. He is tachycardic but normotensive and afebrile. He is nontoxic appearing. His PCP is Dr. Susann GivensLalonde with [facility].   9:20 PM  Spoke with Dr. Izola PriceMyers, hospitalist for admission. She recommended consultation surgery given the abscess to his back and larger.   9:37 PM  Spoke with Dr. Derrell Lollingamirez with surgery who will consult on patient.  Layla MawKristen N Ward, DO 08/09/13 2137

## 2013-08-09 NOTE — H&P (Addendum)
[facility] History and Physical  Jolyn Lentaul I Plair ZOX:[id number] DOB: 09/14/1955 DOA: 08/09/2013  Referring physician: ED physician PCP: [name],[name], MD   Chief Complaint: liver abscess  HPI:  58 year old male with no significant past medical history who presented to [facility] ED 08/09/2013 from PCP office due to right mid abdominal pain which was determined to be based on CT abdomen results,  liver abscess. Pt continues to have the pain, about 2-3/10 in intensity, non radiating, exacerbated with movement and not really relieved with rest. No associated nausea or vomiting. No associated diarrhea or constipation. No blood in stool or urine. Pt also reported he has had this abscess drained before last year in December outpatient. No other complaints of chest pain, palpitations, shortness of breath. No fever or chills.  In ED, vitals were stable with BP 121/79, HR 95 and T 98.3 F, O2 saturation 99%. Blood work revealed WBC count of 14, hemoglobin of 10/9 and then 12.6. CT abdomen revealed significant enlargement of the complex fluid collection in the anterior liver, findings compatible with worsening hepatic abscess. There is also airspace disease in the adjacent right lung base/right middle lobe which could reflect atelectasis or pneumonia.   Assessment and Plan:  Principal Problem:   Liver abscess - started cipro and flagyl - appreciate IR and surgery consult and recomendations Active Problems:   Leukocytosis - likely due to liver abscess - management as above   Anemia - mild, hemoglobin 12.6 - no blood in stool, no signs of active bleeding    Hyponatremia - perhaps mild dehydration - may continue IV fluids for another 12 hours and then reassess in am if pt still needs IV fluids    Abnormal LFT's - AST 62,  ALT 45,   ALP 159 - obtain UDS and ethanol level - follow up abdominal US  Code Status: Full Family Communication: Pt at bedside  Review of Systems:  Constitutional:  Negative for fever, chills and malaise/fatigue. Negative for diaphoresis.  HENT: Negative for hearing loss, ear pain, nosebleeds, congestion, sore throat, neck pain, tinnitus and ear discharge.   Eyes: Negative for blurred vision, double vision, photophobia, pain, discharge and redness.  Respiratory: Negative for cough, hemoptysis, sputum production, shortness of breath, wheezing and stridor.   Cardiovascular: Negative for chest pain, palpitations, orthopnea, claudication and leg swelling.  Gastrointestinal: per HPI Genitourinary: Negative for dysuria, urgency, frequency, hematuria and flank pain.  Musculoskeletal: Negative for myalgias, back pain, joint pain and falls.  Skin: Negative for itching and rash.  Neurological: Negative for dizziness and weakness. Negative for tingling, tremors, sensory change, speech change, focal weakness, loss of consciousness and headaches.  Endo/Heme/Allergies: Negative for environmental allergies and polydipsia. Does not bruise/bleed easily.  Psychiatric/Behavioral: Negative for suicidal ideas. The patient is not nervous/anxious.      Past Medical History  Diagnosis Date  . Allergy     RHINITIS  . ED (erectile dysfunction)   . Congenital anomalies of the testes     History reviewed. No pertinent past surgical history.  Social History:  reports that he has never smoked. He has never used smokeless tobacco. He reports that he does not drink alcohol or use illicit drugs.  No Known Allergies  Family history: hypertension in parents   Prior to Admission medications   Medication Sig Start Date End Date Taking? Authorizing Provider  Multiple Vitamins-Iron (MULTIVITAMINS WITH IRON) TABS tablet Take 1 tablet by mouth daily.   Yes Historical Provider, MD  naproxen sodium (ALEVE)  220 MG tablet Take 440 mg by mouth 2 (two) times daily as needed (for pain).   Yes Historical Provider, MD    Physical Exam: Filed Vitals:   08/09/13 1518 08/09/13 1950  BP:  121/79 134/83  Pulse: 116 95  Temp: 98.3 F (36.8 C)   TempSrc: Oral   Resp: 16 18  SpO2: 99% 100%    Physical Exam  Constitutional: Appears well-developed and well-nourished. No distress.  HENT: Normocephalic. External right and left ear normal. Oropharynx is clear and moist.  Eyes: Conjunctivae and EOM are normal. PERRLA, no scleral icterus.  Neck: Normal ROM. Neck supple. No JVD. No tracheal deviation. No thyromegaly.  CVS: Regular rhythm, tachycardic, S1/S2 +, no murmurs, no gallops, no carotid bruit.  Pulmonary: Effort and breath sounds normal, no stridor, rhonchi, wheezes, rales.  Abdominal: Soft. BS +,  Tender across right mid abdomen; no rebound or guarding  Musculoskeletal: Normal range of motion. No edema and no tenderness.  Lymphadenopathy: No lymphadenopathy noted, cervical, inguinal. Neuro: Alert. Normal reflexes, muscle tone coordination. No cranial nerve deficit. Skin: Skin is warm and dry. No rash noted. Not diaphoretic. No erythema. No pallor.  Psychiatric: Normal mood and affect. Behavior, judgment, thought content normal.   Labs on Admission:  Basic Metabolic Panel:  Recent Labs Lab 08/05/13 1158 08/09/13 1957 08/09/13 2010  NA 133* 136* 137  K 4.1 4.0 3.8  CL 100 98 98  CO2 26 26  --   GLUCOSE 95 87 88  BUN 9 11 9   CREATININE 0.63 0.59 0.60  CALCIUM 8.8 9.3  --    Liver Function Tests:  Recent Labs Lab 08/05/13 1158 08/09/13 1957  AST 44* 62*  ALT 32 45  ALKPHOS 103 159*  BILITOT 0.6 0.6  PROT 7.3 8.8*  ALBUMIN 3.1* 3.0*   CBC:  Recent Labs Lab 08/05/13 1158 08/09/13 1957 08/09/13 2010  WBC 14.0* 14.0*  --   NEUTROABS 10.5* 10.7*  --   HGB 10.4* 10.9* 12.6*  HCT 30.9* 32.7* 37.0*  MCV 80.9 84.7  --   PLT 504* 457*  --    CBG: No results found for this basename: GLUCAP,  in the last 168 hours  Radiological Exams on Admission: Ct Abd Wo & W Cm 08/09/2013   IMPRESSION: Significant enlargement of the complex fluid collection in  the anterior liver, likely not extending from the left hepatic lobe into the right hepatic lobe. This also appears to extend extrahepatic to the anterior abdominal wall with thickening of the right rectus muscle. Findings compatible with worsening hepatic abscess.  Airspace disease in the adjacent right lung base/right middle lobe. This could reflect atelectasis or pneumonia.  Continued enlarged juxta diaphragmatic lymph nodes.    Debbora Presto, MD  [facility] Pager [phone]  If 7PM-7AM, please contact night-coverage [email or web address] Password [facility] 08/09/2013, 9:31 PM

## 2013-08-09 NOTE — ED Provider Notes (Signed)
CSN: [id number]     Arrival date & time 08/09/13  1512 History   First MD Initiated Contact with Patient 08/09/13 1935     Chief Complaint  Patient presents with  . Follow-up     (Consider location/radiation/quality/duration/timing/severity/associated sxs/prior Treatment) HPI 58 yo male presents by request from Dr. Susann Givens after abnormal CT scan showing abscess of the liver. Patient saw Dr. Susann Givens on Monday for RUQ abdominal/ RLQ chest pain. Patient was scheduled to have CT today of abdomen which show Abscess in  Anterior liver and possible pneumonia of RIGHT lower lung lobe. Patient was told to come to ED to come have Liver abscess drained. Patient admits to same pain as above since thanksgiving. Patient states he had the abscess drained before on outpatient basis back in December, here at [facility] hospital. Patient states pain has never gone away. Currently pain is at a 2/10. Patient states he cannot describe the pain. 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 Denies N/V/D/C. Denies CP or SOB, Fever/chills. Denies any additional symptoms. Denies any PMH or current medication use.  Past Medical History  Diagnosis Date  . Allergy     RHINITIS  . ED (erectile dysfunction)   . Congenital anomalies of the testes   . Medical history non-contributory    History reviewed. No pertinent past surgical history. History reviewed. No pertinent family history. History  Substance Use Topics  . Smoking status: Never Smoker   . Smokeless tobacco: Never Used  . Alcohol Use: No    Review of Systems  Gastrointestinal: Positive for abdominal pain (RUQ).  All other systems reviewed and are negative.      Allergies  Review of patient's allergies indicates no known allergies.  Home Medications   No current outpatient prescriptions on file. BP 134/85  Pulse 96  Temp(Src) 99.5 F (37.5 C) (Oral)  Resp 18  Ht 5\' 9"  (1.753 m)  Wt 152 lb 12.5 oz (69.3 kg)   BMI 22.55 kg/m2  SpO2 100% Physical Exam  Nursing note and vitals reviewed. Constitutional: He is oriented to person, place, and time. He appears well-developed and well-nourished. No distress.  HENT:  Head: Normocephalic and atraumatic.  Eyes: Conjunctivae and EOM are normal. Pupils are equal, round, and reactive to light. No scleral icterus.  Neck: Normal range of motion. Neck supple. No JVD present. No tracheal deviation present.  Cardiovascular: Regular rhythm.  Tachycardia present.  Exam reveals no gallop and no friction rub.   No murmur heard. Pulmonary/Chest: Effort normal and breath sounds normal. No respiratory distress. He has no wheezes. He has no rhonchi. He has no rales.  Abdominal: Soft. He exhibits no ascites. Bowel sounds are increased. There is no hepatosplenomegaly. There is no tenderness. There is no rigidity, no rebound, no guarding, no tenderness at McBurney's point and negative Murphy's sign.  Musculoskeletal: Normal range of motion. He exhibits no edema.  Neurological: He is alert and oriented to person, place, and time.  Skin: Skin is warm and dry. No rash noted. He is not diaphoretic.  Psychiatric: He has a normal mood and affect. His behavior is normal.    ED Course  Procedures (including critical care time) Labs Review Labs Reviewed  SURGICAL PCR SCREEN - Abnormal; Notable for the following:    Staphylococcus aureus POSITIVE (*)    All other components within normal limits  CBC WITH DIFFERENTIAL - Abnormal; Notable for the following:    WBC 14.0 (*)  RBC 3.86 (*)    Hemoglobin 10.9 (*)    HCT 32.7 (*)    Platelets 457 (*)    Neutro Abs 10.7 (*)    Monocytes Absolute 1.5 (*)    All other components within normal limits  COMPREHENSIVE METABOLIC PANEL - Abnormal; Notable for the following:    Sodium 136 (*)    Total Protein 8.8 (*)    Albumin 3.0 (*)    AST 62 (*)    Alkaline Phosphatase 159 (*)    All other components within normal limits   COMPREHENSIVE METABOLIC PANEL - Abnormal; Notable for the following:    Sodium 135 (*)    Albumin 2.3 (*)    AST 50 (*)    Alkaline Phosphatase 137 (*)    All other components within normal limits  CBC - Abnormal; Notable for the following:    WBC 11.7 (*)    RBC 3.49 (*)    Hemoglobin 9.7 (*)    HCT 29.2 (*)    All other components within normal limits  COMPREHENSIVE METABOLIC PANEL - Abnormal; Notable for the following:    Sodium 133 (*)    Glucose, Bld 109 (*)    Albumin 2.5 (*)    AST 55 (*)    Alkaline Phosphatase 140 (*)    All other components within normal limits  CBC WITH DIFFERENTIAL - Abnormal; Notable for the following:    WBC 12.1 (*)    RBC 3.63 (*)    Hemoglobin 10.1 (*)    HCT 30.5 (*)    Neutrophils Relative % 79 (*)    Neutro Abs 9.5 (*)    Lymphocytes Relative 10 (*)    Monocytes Absolute 1.3 (*)    All other components within normal limits  POCT I-STAT, CHEM 8 - Abnormal; Notable for the following:    Calcium, Ion 1.24 (*)    Hemoglobin 12.6 (*)    HCT 37.0 (*)    All other components within normal limits  BODY FLUID CULTURE  GRAM STAIN  CULTURE, BLOOD (ROUTINE X 2)  CULTURE, BLOOD (ROUTINE X 2)  PROTIME-INR  URINE RAPID DRUG SCREEN (HOSP PERFORMED)  ETHANOL  PROTIME-INR  APTT  MAGNESIUM  PHOSPHORUS  APTT  PROTIME-INR  GLUCOSE, CAPILLARY  TSH   Imaging Review US Abdomen Complete  08/09/2013   CLINICAL DATA:  Abnormal liver function tests.  Liver abscess.  EXAM: ULTRASOUND ABDOMEN COMPLETE  COMPARISON:  CT abdomen 08/09/2013  FINDINGS: Gallbladder:  No gallstones or wall thickening visualized. No sonographic Murphy sign noted.  Common bile duct:  Diameter: 3 mm  Liver:  As seen on recent CT, there is a large, complex collection involving both right and left hepatic lobes measuring approximately 13.9 x 8.3 x 10.6 cm with some mild peripheral vascularity. Liver was grossly normal in echogenicity elsewhere. No gross intrahepatic biliary dilatation  identified.  IVC:  No abnormality visualized.  Pancreas:  Not well visualized.  Spleen:  Size and appearance within normal limits.  Right Kidney:  Length: 13.0 cm. Echogenicity within normal limits. No mass or hydronephrosis visualized.  Left Kidney:  Length: 12.7 cm. Echogenicity within normal limits. No mass or hydronephrosis visualized.  Abdominal aorta:  No aneurysm visualized.  Other findings:  None.  IMPRESSION: Large, complex collection in the liver, consistent with previously described abscess.   Electronically Signed   By: Sebastian Ache   On: 08/09/2013 23:06   Ct Abd Wo & W Cm  08/09/2013   CLINICAL DATA:  History of liver abscess.  Follow-up.  EXAM: CT ABDOMEN WITHOUT AND WITH CONTRAST  TECHNIQUE: Multidetector CT imaging of the abdomen was performed following the standard protocol before and following the bolus administration of intravenous contrast.  CONTRAST:  100mL OMNIPAQUE IOHEXOL 300 MG/ML  SOLN  COMPARISON:  06/14/2013  FINDINGS: Large complex fluid collection noted within the medial segment of the left hepatic lobe and extending into the right hepatic lobe. This has increased in size since prior study. This currently measures approximately 14.3 x 7.5 cm on image 22 of series 8. This previously measured 7.4 x 5.9 cm. This is compatible with worsening hepatic abscess. There is probable extrahepatic extension anteriorly with abnormal soft tissue in fluid extending anteriorly to the anterior abdominal wall. The overlying rectus muscle is thickened and possibly involved.  Other small low-density lesions within the liver are stable and compatible with cysts. Spleen, pancreas, adrenals and kidneys are normal. Visualized large and small bowel unremarkable. Stomach grossly unremarkable.  Airspace opacity is noted within the right lung base, most pronounced in the right middle lobe. This may reflect atelectasis although pneumonia cannot be completely excluded. There are enlarged just subdiaphragmatic  lymph nodes anteriorly. These are similar to prior study. Index node anterior to the liver margin has a short axis diameter of 7 mm compared with 10 mm previously.  Aorta is normal caliber. Shotty retroperitoneal lymph nodes which are not pathologically enlarged.  No acute bony abnormality.  IMPRESSION: Significant enlargement of the complex fluid collection in the anterior liver, likely not extending from the left hepatic lobe into the right hepatic lobe. This also appears to extend extrahepatic to the anterior abdominal wall with thickening of the right rectus muscle. Findings compatible with worsening hepatic abscess.  Airspace disease in the adjacent right lung base/right middle lobe. This could reflect atelectasis or pneumonia.  Continued enlarged juxta diaphragmatic lymph nodes.  Critical Value/emergent results were called by telephone at the time of interpretation on 08/09/2013 at 1:20PM to Dr. Sharlot GowdaJOHN LALONDE , who verbally acknowledged these results.   Electronically Signed   By: Charlett NoseKevin  Dover M.D.   On: 08/09/2013 13:37      MDM   Final diagnoses:  Liver abscess   Liver abscess noted on CT. Abscess confirmed with US.  Leukocytosis of 14 unchanged since previous.  Thrombocytosis appears stable compared to previous studies AST elevated  Patient afebrile. Tachycardia on admission, resolved throughout stay in ED  Patient discussed with Dr. Elesa MassedWard. IR consulted and recommend patient be started on IV antibiotics and admitted to hospital. IR can see patient tomorrow for drainage of abscess.  Patient started on IV cipro/flagyl.         Rudene AndaJacob Gray Macyn Shropshire, PA-C 08/10/13 1238

## 2013-08-10 ENCOUNTER — Inpatient Hospital Stay (HOSPITAL_COMMUNITY): Payer: BC Managed Care – PPO

## 2013-08-10 ENCOUNTER — Encounter (HOSPITAL_COMMUNITY): Payer: Self-pay | Admitting: Radiology

## 2013-08-10 LAB — COMPREHENSIVE METABOLIC PANEL
ALBUMIN: 2.5 g/dL — AB (ref 3.5–5.2)
ALT: 40 U/L (ref 0–53)
ALT: 37 U/L (ref 0–53)
AST: 55 U/L — ABNORMAL HIGH (ref 0–37)
AST: 50 U/L — AB (ref 0–37)
Albumin: 2.3 g/dL — ABNORMAL LOW (ref 3.5–5.2)
Alkaline Phosphatase: 140 U/L — ABNORMAL HIGH (ref 39–117)
Alkaline Phosphatase: 137 U/L — ABNORMAL HIGH (ref 39–117)
BILIRUBIN TOTAL: 0.4 mg/dL (ref 0.3–1.2)
BUN: 11 mg/dL (ref 6–23)
BUN: 11 mg/dL (ref 6–23)
CALCIUM: 8.9 mg/dL (ref 8.4–10.5)
CALCIUM: 8.5 mg/dL (ref 8.4–10.5)
CHLORIDE: 99 meq/L (ref 96–112)
CO2: 26 mEq/L (ref 19–32)
CO2: 26 mEq/L (ref 19–32)
CREATININE: 0.67 mg/dL (ref 0.50–1.35)
Chloride: 96 mEq/L (ref 96–112)
Creatinine, Ser: 0.6 mg/dL (ref 0.50–1.35)
GFR calc Af Amer: >90 mL/min (ref >90)
GFR calc non Af Amer: >90 mL/min (ref >90)
GLUCOSE: 109 mg/dL — AB (ref 70–99)
Glucose, Bld: 83 mg/dL (ref 70–99)
Potassium: 3.9 mEq/L (ref 3.7–5.3)
Potassium: 4.5 mEq/L (ref 3.7–5.3)
SODIUM: 133 meq/L — AB (ref 137–147)
Sodium: 135 mEq/L — ABNORMAL LOW (ref 137–147)
TOTAL PROTEIN: 7.9 g/dL (ref 6.0–8.3)
Total Bilirubin: 0.5 mg/dL (ref 0.3–1.2)
Total Protein: 7.4 g/dL (ref 6.0–8.3)

## 2013-08-10 LAB — RAPID URINE DRUG SCREEN, HOSP PERFORMED
AMPHETAMINES: NOT DETECTED
BENZODIAZEPINES: NOT DETECTED
Barbiturates: NOT DETECTED
COCAINE: NOT DETECTED
Opiates: NOT DETECTED
Tetrahydrocannabinol: NOT DETECTED

## 2013-08-10 LAB — CBC
HCT: 29.2 % — ABNORMAL LOW (ref 39.0–52.0)
HEMOGLOBIN: 9.7 g/dL — AB (ref 13.0–17.0)
MCH: 27.8 pg (ref 26.0–34.0)
MCHC: 33.2 g/dL (ref 30.0–36.0)
MCV: 83.7 fL (ref 78.0–100.0)
Platelets: 397 10*3/uL (ref 150–400)
RBC: 3.49 MIL/uL — AB (ref 4.22–5.81)
RDW: 14.5 % (ref 11.5–15.5)
WBC: 11.7 10*3/uL — AB (ref 4.0–10.5)

## 2013-08-10 LAB — APTT
APTT: 31 s (ref 24–37)
aPTT: 33 seconds (ref 24–37)

## 2013-08-10 LAB — PHOSPHORUS: Phosphorus: 3 mg/dL (ref 2.3–4.6)

## 2013-08-10 LAB — SURGICAL PCR SCREEN
MRSA, PCR: NEGATIVE
STAPHYLOCOCCUS AUREUS: POSITIVE — AB

## 2013-08-10 LAB — PROTIME-INR
INR: 1.07 (ref 0.00–1.49)
INR: 1.08 (ref 0.00–1.49)
PROTHROMBIN TIME: 13.8 s (ref 11.6–15.2)
Prothrombin Time: 13.7 seconds (ref 11.6–15.2)

## 2013-08-10 LAB — GLUCOSE, CAPILLARY: Glucose-Capillary: 92 mg/dL (ref 70–99)

## 2013-08-10 LAB — TSH: TSH: 0.786 u[IU]/mL (ref 0.350–4.500)

## 2013-08-10 LAB — ETHANOL

## 2013-08-10 LAB — MAGNESIUM: Magnesium: 1.9 mg/dL (ref 1.5–2.5)

## 2013-08-10 MED ORDER — HEPARIN SODIUM (PORCINE) 5000 UNIT/ML IJ SOLN
5000.0000 [IU] | Freq: Three times a day (TID) | INTRAMUSCULAR | Status: DC
  Administered 2013-08-11 (×2): 5000 [IU] via SUBCUTANEOUS
  Filled 2013-08-10 (×9): qty 1

## 2013-08-10 MED ORDER — FENTANYL CITRATE 0.05 MG/ML IJ SOLN
INTRAMUSCULAR | Status: AC
  Filled 2013-08-10: qty 4

## 2013-08-10 MED ORDER — CHLORHEXIDINE GLUCONATE CLOTH 2 % EX PADS
6.0000 | MEDICATED_PAD | Freq: Every day | CUTANEOUS | Status: DC
  Administered 2013-08-10 – 2013-08-12 (×3): 6 via TOPICAL

## 2013-08-10 MED ORDER — MIDAZOLAM HCL 2 MG/2ML IJ SOLN
INTRAMUSCULAR | Status: AC
Start: 2013-08-10 — End: 2013-08-11
  Filled 2013-08-10: qty 4

## 2013-08-10 MED ORDER — MIDAZOLAM HCL 2 MG/2ML IJ SOLN
INTRAMUSCULAR | Status: AC | PRN
  Administered 2013-08-10: 2 mg via INTRAVENOUS
  Administered 2013-08-10: 0.5 mg via INTRAVENOUS

## 2013-08-10 MED ORDER — MUPIROCIN 2 % EX OINT
1.0000 "application " | TOPICAL_OINTMENT | Freq: Two times a day (BID) | CUTANEOUS | Status: DC
  Administered 2013-08-10 – 2013-08-12 (×4): 1 via NASAL
  Filled 2013-08-10: qty 22

## 2013-08-10 MED ORDER — SODIUM CHLORIDE 0.9 % IV SOLN
INTRAVENOUS | Status: DC
  Administered 2013-08-10 – 2013-08-11 (×2): via INTRAVENOUS

## 2013-08-10 MED ORDER — ENSURE COMPLETE PO LIQD
237.0000 mL | ORAL | Status: DC
  Administered 2013-08-10 – 2013-08-11 (×2): 237 mL via ORAL

## 2013-08-10 MED ORDER — LIDOCAINE HCL 1 % IJ SOLN
INTRAMUSCULAR | Status: AC
  Filled 2013-08-10: qty 10

## 2013-08-10 MED ORDER — FENTANYL CITRATE 0.05 MG/ML IJ SOLN
INTRAMUSCULAR | Status: AC | PRN
Start: 2013-08-10 — End: 2013-08-10
  Administered 2013-08-10: 50 ug via INTRAVENOUS
  Administered 2013-08-10: 25 ug via INTRAVENOUS

## 2013-08-10 NOTE — Consult Note (Signed)
[facility] CONSULT Reason for consult: Recurred Hepatic Abscess Referring Physician: [facility]. PCP: Aaron Leonard is an 58 y.o. male.  HPI: He developed hepatic abscess which was trained percutaneously by IR 12/14. The patient was supposed to have a follow-up CT scan but for unclear reasons did not. He reports he continued to have discomfort even after the drainage. He presented back to [facility] [employer] would continue right upper quadrant pain and repeat CT scan showed recurrent hepatic abscess. When asked to see the patient regarding what could be causing his recurrent abscess. The patient has never had acted diverticulitis. CT scan does not show any other entry abdominal or pelvic source of the abscess ultrasound of the gallbladder was normal. She denies any diarrhea and the other symptoms. He basically has been fairly healthy.  Past Medical History  Diagnosis Date  . Allergy     RHINITIS  . ED (erectile dysfunction)   . Congenital anomalies of the testes   . Medical history non-contributory     History reviewed. No pertinent past surgical history.  History reviewed. No pertinent family history.  Social History:  reports that he has never smoked. He has never used smokeless tobacco. He reports that he does not drink alcohol or use illicit drugs.  Allergies: No Known Allergies  Medications; Prior to Admission medications   Medication Sig Start Date End Date Taking? Authorizing Provider  Multiple Vitamins-Iron (MULTIVITAMINS WITH IRON) TABS tablet Take 1 tablet by mouth daily.   Yes Historical Provider, MD  naproxen sodium (ALEVE) 220 MG tablet Take 440 mg by mouth 2 (two) times daily as needed (for pain).   Yes Historical Provider, MD   . Chlorhexidine Gluconate Cloth  6 each Topical Daily  . ciprofloxacin  400 mg Intravenous Q12H  . fentaNYL      . heparin subcutaneous  5,000 Units Subcutaneous 3 times per day  . lidocaine      . metronidazole  500 mg  Intravenous Q8H  . midazolam      . mupirocin ointment  1 application Nasal BID   PRN Meds acetaminophen, acetaminophen, HYDROcodone-acetaminophen, morphine injection, ondansetron (ZOFRAN) IV, ondansetron Results for orders placed during the hospital encounter of 08/09/13 (from the past 48 hour(s))  CBC WITH DIFFERENTIAL     Status: Abnormal   Collection Time    08/09/13  7:57 PM      Result Value Ref Range   WBC 14.0 (*) 4.0 - 10.5 K/uL   RBC 3.86 (*) 4.22 - 5.81 MIL/uL   Hemoglobin 10.9 (*) 13.0 - 17.0 g/dL   HCT 32.7 (*) 39.0 - 52.0 %   MCV 84.7  78.0 - 100.0 fL   MCH 28.2  26.0 - 34.0 pg   MCHC 33.3  30.0 - 36.0 g/dL   RDW 14.6  11.5 - 15.5 %   Platelets 457 (*) 150 - 400 K/uL   Neutrophils Relative % 76  43 - 77 %   Neutro Abs 10.7 (*) 1.7 - 7.7 K/uL   Lymphocytes Relative 12  12 - 46 %   Lymphs Abs 1.7  0.7 - 4.0 K/uL   Monocytes Relative 11  3 - 12 %   Monocytes Absolute 1.5 (*) 0.1 - 1.0 K/uL   Eosinophils Relative 0  0 - 5 %   Eosinophils Absolute 0.1  0.0 - 0.7 K/uL   Basophils Relative 0  0 - 1 %   Basophils Absolute 0.0  0.0 - 0.1 K/uL  COMPREHENSIVE  METABOLIC PANEL     Status: Abnormal   Collection Time    08/09/13  7:57 PM      Result Value Ref Range   Sodium 136 (*) 137 - 147 mEq/L   Potassium 4.0  3.7 - 5.3 mEq/L   Chloride 98  96 - 112 mEq/L   CO2 26  19 - 32 mEq/L   Glucose, Bld 87  70 - 99 mg/dL   BUN 11  6 - 23 mg/dL   Creatinine, Ser 0.59  0.50 - 1.35 mg/dL   Calcium 9.3  8.4 - 10.5 mg/dL   Total Protein 8.8 (*) 6.0 - 8.3 g/dL   Albumin 3.0 (*) 3.5 - 5.2 g/dL   AST 62 (*) 0 - 37 U/L   ALT 45  0 - 53 U/L   Alkaline Phosphatase 159 (*) 39 - 117 U/L   Total Bilirubin 0.6  0.3 - 1.2 mg/dL   GFR calc non Af Amer >90  >90 mL/min   GFR calc Af Amer >90  >90 mL/min   Comment: (NOTE)     The eGFR has been calculated using the CKD EPI equation.     This calculation has not been validated in all clinical situations.     eGFR's persistently <90 mL/min  signify possible Chronic Kidney     Disease.  PROTIME-INR     Status: None   Collection Time    08/09/13  8:09 PM      Result Value Ref Range   Prothrombin Time 13.6  11.6 - 15.2 seconds   INR 1.06  0.00 - 1.49  POCT I-STAT, CHEM 8     Status: Abnormal   Collection Time    08/09/13  8:10 PM      Result Value Ref Range   Sodium 137  137 - 147 mEq/L   Potassium 3.8  3.7 - 5.3 mEq/L   Chloride 98  96 - 112 mEq/L   BUN 9  6 - 23 mg/dL   Creatinine, Ser 0.60  0.50 - 1.35 mg/dL   Glucose, Bld 88  70 - 99 mg/dL   Calcium, Ion 1.24 (*) 1.12 - 1.23 mmol/L   TCO2 26  0 - 100 mmol/L   Hemoglobin 12.6 (*) 13.0 - 17.0 g/dL   HCT 37.0 (*) 39.0 - 52.0 %  ETHANOL     Status: None   Collection Time    08/09/13 11:22 PM      Result Value Ref Range   Alcohol, Ethyl (B) <11  0 - 11 mg/dL   Comment:            LOWEST DETECTABLE LIMIT FOR     SERUM ALCOHOL IS 11 mg/dL     FOR MEDICAL PURPOSES ONLY  COMPREHENSIVE METABOLIC PANEL     Status: Abnormal   Collection Time    08/09/13 11:22 PM      Result Value Ref Range   Sodium 133 (*) 137 - 147 mEq/L   Potassium 3.9  3.7 - 5.3 mEq/L   Chloride 96  96 - 112 mEq/L   CO2 26  19 - 32 mEq/L   Glucose, Bld 109 (*) 70 - 99 mg/dL   BUN 11  6 - 23 mg/dL   Creatinine, Ser 0.60  0.50 - 1.35 mg/dL   Calcium 8.9  8.4 - 10.5 mg/dL   Total Protein 7.9  6.0 - 8.3 g/dL   Albumin 2.5 (*) 3.5 - 5.2 g/dL   AST 55 (*)  0 - 37 U/L   ALT 40  0 - 53 U/L   Alkaline Phosphatase 140 (*) 39 - 117 U/L   Total Bilirubin 0.5  0.3 - 1.2 mg/dL   GFR calc non Af Amer >90  >90 mL/min   GFR calc Af Amer >90  >90 mL/min   Comment: (NOTE)     The eGFR has been calculated using the CKD EPI equation.     This calculation has not been validated in all clinical situations.     eGFR's persistently <90 mL/min signify possible Chronic Kidney     Disease.  MAGNESIUM     Status: None   Collection Time    08/09/13 11:22 PM      Result Value Ref Range   Magnesium 1.9  1.5 - 2.5  mg/dL  PHOSPHORUS     Status: None   Collection Time    08/09/13 11:22 PM      Result Value Ref Range   Phosphorus 3.0  2.3 - 4.6 mg/dL  CBC WITH DIFFERENTIAL     Status: Abnormal   Collection Time    08/09/13 11:22 PM      Result Value Ref Range   WBC 12.1 (*) 4.0 - 10.5 K/uL   RBC 3.63 (*) 4.22 - 5.81 MIL/uL   Hemoglobin 10.1 (*) 13.0 - 17.0 g/dL   Comment: DELTA CHECK NOTED     REPEATED TO VERIFY   HCT 30.5 (*) 39.0 - 52.0 %   MCV 84.0  78.0 - 100.0 fL   MCH 27.8  26.0 - 34.0 pg   MCHC 33.1  30.0 - 36.0 g/dL   RDW 14.3  11.5 - 15.5 %   Platelets 389  150 - 400 K/uL   Neutrophils Relative % 79 (*) 43 - 77 %   Neutro Abs 9.5 (*) 1.7 - 7.7 K/uL   Lymphocytes Relative 10 (*) 12 - 46 %   Lymphs Abs 1.2  0.7 - 4.0 K/uL   Monocytes Relative 11  3 - 12 %   Monocytes Absolute 1.3 (*) 0.1 - 1.0 K/uL   Eosinophils Relative 0  0 - 5 %   Eosinophils Absolute 0.0  0.0 - 0.7 K/uL   Basophils Relative 0  0 - 1 %   Basophils Absolute 0.0  0.0 - 0.1 K/uL  APTT     Status: None   Collection Time    08/09/13 11:22 PM      Result Value Ref Range   aPTT 31  24 - 37 seconds  PROTIME-INR     Status: None   Collection Time    08/09/13 11:22 PM      Result Value Ref Range   Prothrombin Time 13.7  11.6 - 15.2 seconds   INR 1.07  0.00 - 1.49  SURGICAL PCR SCREEN     Status: Abnormal   Collection Time    08/09/13 11:27 PM      Result Value Ref Range   MRSA, PCR NEGATIVE  NEGATIVE   Staphylococcus aureus POSITIVE (*) NEGATIVE   Comment:            The Xpert SA Assay (FDA     approved for NASAL specimens     in patients over 34 years of age),     is one component of     a comprehensive surveillance     program.  Test performance has     been validated by [employer] for  patients greater     than or equal to 32 year old.     It is not intended     to diagnose infection nor to     guide or monitor treatment.  URINE RAPID DRUG SCREEN (HOSP PERFORMED)     Status: None   Collection  Time    08/10/13  4:53 AM      Result Value Ref Range   Opiates NONE DETECTED  NONE DETECTED   Cocaine NONE DETECTED  NONE DETECTED   Benzodiazepines NONE DETECTED  NONE DETECTED   Amphetamines NONE DETECTED  NONE DETECTED   Tetrahydrocannabinol NONE DETECTED  NONE DETECTED   Barbiturates NONE DETECTED  NONE DETECTED   Comment:            DRUG SCREEN FOR MEDICAL PURPOSES     ONLY.  IF CONFIRMATION IS NEEDED     FOR ANY PURPOSE, NOTIFY LAB     WITHIN 5 DAYS.                LOWEST DETECTABLE LIMITS     FOR URINE DRUG SCREEN     Drug Class       Cutoff (ng/mL)     Amphetamine      1000     Barbiturate      200     Benzodiazepine   403     Tricyclics       474     Opiates          300     Cocaine          300     THC              50  COMPREHENSIVE METABOLIC PANEL     Status: Abnormal   Collection Time    08/10/13  5:44 AM      Result Value Ref Range   Sodium 135 (*) 137 - 147 mEq/L   Potassium 4.5  3.7 - 5.3 mEq/L   Chloride 99  96 - 112 mEq/L   CO2 26  19 - 32 mEq/L   Glucose, Bld 83  70 - 99 mg/dL   BUN 11  6 - 23 mg/dL   Creatinine, Ser 0.67  0.50 - 1.35 mg/dL   Calcium 8.5  8.4 - 10.5 mg/dL   Total Protein 7.4  6.0 - 8.3 g/dL   Albumin 2.3 (*) 3.5 - 5.2 g/dL   AST 50 (*) 0 - 37 U/L   ALT 37  0 - 53 U/L   Alkaline Phosphatase 137 (*) 39 - 117 U/L   Total Bilirubin 0.4  0.3 - 1.2 mg/dL   GFR calc non Af Amer >90  >90 mL/min   GFR calc Af Amer >90  >90 mL/min   Comment: (NOTE)     The eGFR has been calculated using the CKD EPI equation.     This calculation has not been validated in all clinical situations.     eGFR's persistently <90 mL/min signify possible Chronic Kidney     Disease.  CBC     Status: Abnormal   Collection Time    08/10/13  5:44 AM      Result Value Ref Range   WBC 11.7 (*) 4.0 - 10.5 K/uL   RBC 3.49 (*) 4.22 - 5.81 MIL/uL   Hemoglobin 9.7 (*) 13.0 - 17.0 g/dL   HCT 29.2 (*) 39.0 - 52.0 %   MCV 83.7  78.0 - 100.0 fL  MCH 27.8  26.0 - 34.0 pg    MCHC 33.2  30.0 - 36.0 g/dL   RDW 14.5  11.5 - 15.5 %   Platelets 397  150 - 400 K/uL  PROTIME-INR     Status: None   Collection Time    08/10/13  5:44 AM      Result Value Ref Range   Prothrombin Time 13.8  11.6 - 15.2 seconds   INR 1.08  0.00 - 1.49  APTT     Status: None   Collection Time    08/10/13  5:44 AM      Result Value Ref Range   aPTT 33  24 - 37 seconds  GLUCOSE, CAPILLARY     Status: None   Collection Time    08/10/13  7:36 AM      Result Value Ref Range   Glucose-Capillary 92  70 - 99 mg/dL    US Abdomen Complete  08/09/2013   CLINICAL DATA:  Abnormal liver function tests.  Liver abscess.  EXAM: ULTRASOUND ABDOMEN COMPLETE  COMPARISON:  CT abdomen 08/09/2013  FINDINGS: Gallbladder:  No gallstones or wall thickening visualized. No sonographic Murphy sign noted.  Common bile duct:  Diameter: 3 mm  Liver:  As seen on recent CT, there is a large, complex collection involving both right and left hepatic lobes measuring approximately 13.9 x 8.3 x 10.6 cm with some mild peripheral vascularity. Liver was grossly normal in echogenicity elsewhere. No gross intrahepatic biliary dilatation identified.  IVC:  No abnormality visualized.  Pancreas:  Not well visualized.  Spleen:  Size and appearance within normal limits.  Right Kidney:  Length: 13.0 cm. Echogenicity within normal limits. No mass or hydronephrosis visualized.  Left Kidney:  Length: 12.7 cm. Echogenicity within normal limits. No mass or hydronephrosis visualized.  Abdominal aorta:  No aneurysm visualized.  Other findings:  None.  IMPRESSION: Large, complex collection in the liver, consistent with previously described abscess.   Electronically Signed   By: Logan Bores   On: 08/09/2013 23:06   Ct Abd Wo & W Cm  08/09/2013   CLINICAL DATA:  History of liver abscess.  Follow-up.  EXAM: CT ABDOMEN WITHOUT AND WITH CONTRAST  TECHNIQUE: Multidetector CT imaging of the abdomen was performed following the standard protocol before  and following the bolus administration of intravenous contrast.  CONTRAST:  175m OMNIPAQUE IOHEXOL 300 MG/ML  SOLN  COMPARISON:  06/14/2013  FINDINGS: Large complex fluid collection noted within the medial segment of the left hepatic lobe and extending into the right hepatic lobe. This has increased in size since prior study. This currently measures approximately 14.3 x 7.5 cm on image 22 of series 8. This previously measured 7.4 x 5.9 cm. This is compatible with worsening hepatic abscess. There is probable extrahepatic extension anteriorly with abnormal soft tissue in fluid extending anteriorly to the anterior abdominal wall. The overlying rectus muscle is thickened and possibly involved.  Other small low-density lesions within the liver are stable and compatible with cysts. Spleen, pancreas, adrenals and kidneys are normal. Visualized large and small bowel unremarkable. Stomach grossly unremarkable.  Airspace opacity is noted within the right lung base, most pronounced in the right middle lobe. This may reflect atelectasis although pneumonia cannot be completely excluded. There are enlarged just subdiaphragmatic lymph nodes anteriorly. These are similar to prior study. Index node anterior to the liver margin has a short axis diameter of 7 mm compared with 10 mm previously.  Aorta is normal caliber.  Shotty retroperitoneal lymph nodes which are not pathologically enlarged.  No acute bony abnormality.  IMPRESSION: Significant enlargement of the complex fluid collection in the anterior liver, likely not extending from the left hepatic lobe into the right hepatic lobe. This also appears to extend extrahepatic to the anterior abdominal wall with thickening of the right rectus muscle. Findings compatible with worsening hepatic abscess.  Airspace disease in the adjacent right lung base/right middle lobe. This could reflect atelectasis or pneumonia.  Continued enlarged juxta diaphragmatic lymph nodes.  Critical  Value/emergent results were called by telephone at the time of interpretation on 08/09/2013 at 1:20PM to Dr. Jill Alexanders , who verbally acknowledged these results.   Electronically Signed   By: Rolm Baptise M.D.   On: 08/09/2013 13:37              Blood pressure 103/59, pulse 89, temperature 99.5 F (37.5 C), temperature source Oral, resp. rate 22, height 5' 9"  (1.753 m), weight 69.3 kg (152 lb 12.5 oz), SpO2 100.00%.  Physical exam:   General-- pleasant African-American male in no acute distress Heart-- regular rate and rhythm without murmurs are gallops  Lungs--clear  Abdomen-- none distended and generally soft with mild tenderness in the right upper quadrant    Assessment: 1. Recurrent Hepatic Abscess. This will definitely need to be trained and will likely need to have an indwelling drain left in place for a while to be sure that it is adequately decompressed. I'm not sure that will ever completely be able to determine what caused the abscess.   Plan: 1. Would proceed with percutaneous drainage. After the abscess is been adequately drained which may take some time even with follow-up as an outpatient, he may need further evaluation of his G.I. tracks such as colonoscopy just to make sure there is no lesion of the lower G.I. tract. We will follow here in the hospital.    Aaron Leonard,Kanesha Cadle L 08/10/2013, 3:08 PM

## 2013-08-10 NOTE — H&P (Signed)
Agree.  Recurrent hepatic abscess.  Now needs percutaneous drain placed.  Will proceed with CT guided catheter drainage later today.

## 2013-08-10 NOTE — H&P (Signed)
Chief Complaint: "Right upper abdominal pain." Referring Physician: Dr. Candiss Norse HPI: Aaron Leonard is an 58 y.o. male with a history of a liver abscess s/p CT aspiration 06/17/13 with moderate peptostreptococcus species. The patient states his pain has never went away since thanksgiving time and still complains of 3/10 right upper abdominal pain today. He denies any drug use or history of malignancy, he has been taking (2) aleve daily for pain. He denies any chest pain or shortness of breath, he denies any nausea or vomiting, fever or chills. He denies any active bleeding. He denies any complications with previous procedure sedation, denies any history of sleep apnea or chronic oxygen use.   Past Medical History:  Past Medical History  Diagnosis Date  . Allergy     RHINITIS  . ED (erectile dysfunction)   . Congenital anomalies of the testes   . Medical history non-contributory     Past Surgical History: History reviewed. No pertinent past surgical history.  Family History: History reviewed. No pertinent family history.  Social History:  reports that he has never smoked. He has never used smokeless tobacco. He reports that he does not drink alcohol or use illicit drugs.  Allergies: No Known Allergies  Medications:   Medication List    ASK your doctor about these medications       ALEVE 220 MG tablet  Generic drug:  naproxen sodium  Take 440 mg by mouth 2 (two) times daily as needed (for pain).     multivitamins with iron Tabs tablet  Take 1 tablet by mouth daily.       Please HPI for pertinent positives, otherwise complete 10 system ROS negative.  Physical Exam: BP 134/85  Pulse 96  Temp(Src) 99.5 F (37.5 C) (Oral)  Resp 18  Ht 5' 9" (1.753 m)  Wt 152 lb 12.5 oz (69.3 kg)  BMI 22.55 kg/m2  SpO2 100% Body mass index is 22.55 kg/(m^2).  General Appearance:  Alert, cooperative, no distress  Head:  Normocephalic, without obvious abnormality, atraumatic  Neck: Supple,  symmetrical, trachea midline  Lungs:   Clear to auscultation bilaterally, no w/r/r, respirations unlabored without use of accessory muscles.  Chest Wall:  No tenderness or deformity  Heart:  Regular rate and rhythm, S1, S2 normal, no murmur, rub or gallop.  Abdomen:   Soft, RUQ tenderness, non distended, (+) BS  Extremities: Extremities normal, atraumatic, no cyanosis or edema  Pulses: 1+ and symmetric  Neurologic: Normal affect, no gross deficits.   Results for orders placed during the hospital encounter of 08/09/13 (from the past 48 hour(s))  CBC WITH DIFFERENTIAL     Status: Abnormal   Collection Time    08/09/13  7:57 PM      Result Value Ref Range   WBC 14.0 (*) 4.0 - 10.5 K/uL   RBC 3.86 (*) 4.22 - 5.81 MIL/uL   Hemoglobin 10.9 (*) 13.0 - 17.0 g/dL   HCT 32.7 (*) 39.0 - 52.0 %   MCV 84.7  78.0 - 100.0 fL   MCH 28.2  26.0 - 34.0 pg   MCHC 33.3  30.0 - 36.0 g/dL   RDW 14.6  11.5 - 15.5 %   Platelets 457 (*) 150 - 400 K/uL   Neutrophils Relative % 76  43 - 77 %   Neutro Abs 10.7 (*) 1.7 - 7.7 K/uL   Lymphocytes Relative 12  12 - 46 %   Lymphs Abs 1.7  0.7 - 4.0 K/uL   Monocytes  Relative 11  3 - 12 %   Monocytes Absolute 1.5 (*) 0.1 - 1.0 K/uL   Eosinophils Relative 0  0 - 5 %   Eosinophils Absolute 0.1  0.0 - 0.7 K/uL   Basophils Relative 0  0 - 1 %   Basophils Absolute 0.0  0.0 - 0.1 K/uL  COMPREHENSIVE METABOLIC PANEL     Status: Abnormal   Collection Time    08/09/13  7:57 PM      Result Value Ref Range   Sodium 136 (*) 137 - 147 mEq/L   Potassium 4.0  3.7 - 5.3 mEq/L   Chloride 98  96 - 112 mEq/L   CO2 26  19 - 32 mEq/L   Glucose, Bld 87  70 - 99 mg/dL   BUN 11  6 - 23 mg/dL   Creatinine, Ser 0.59  0.50 - 1.35 mg/dL   Calcium 9.3  8.4 - 10.5 mg/dL   Total Protein 8.8 (*) 6.0 - 8.3 g/dL   Albumin 3.0 (*) 3.5 - 5.2 g/dL   AST 62 (*) 0 - 37 U/L   ALT 45  0 - 53 U/L   Alkaline Phosphatase 159 (*) 39 - 117 U/L   Total Bilirubin 0.6  0.3 - 1.2 mg/dL   GFR calc non  Af Amer >90  >90 mL/min   GFR calc Af Amer >90  >90 mL/min   Comment: (NOTE)     The eGFR has been calculated using the CKD EPI equation.     This calculation has not been validated in all clinical situations.     eGFR's persistently <90 mL/min signify possible Chronic Kidney     Disease.  PROTIME-INR     Status: None   Collection Time    08/09/13  8:09 PM      Result Value Ref Range   Prothrombin Time 13.6  11.6 - 15.2 seconds   INR 1.06  0.00 - 1.49  POCT I-STAT, CHEM 8     Status: Abnormal   Collection Time    08/09/13  8:10 PM      Result Value Ref Range   Sodium 137  137 - 147 mEq/L   Potassium 3.8  3.7 - 5.3 mEq/L   Chloride 98  96 - 112 mEq/L   BUN 9  6 - 23 mg/dL   Creatinine, Ser 0.60  0.50 - 1.35 mg/dL   Glucose, Bld 88  70 - 99 mg/dL   Calcium, Ion 1.24 (*) 1.12 - 1.23 mmol/L   TCO2 26  0 - 100 mmol/L   Hemoglobin 12.6 (*) 13.0 - 17.0 g/dL   HCT 37.0 (*) 39.0 - 52.0 %  ETHANOL     Status: None   Collection Time    08/09/13 11:22 PM      Result Value Ref Range   Alcohol, Ethyl (B) <11  0 - 11 mg/dL   Comment:            LOWEST DETECTABLE LIMIT FOR     SERUM ALCOHOL IS 11 mg/dL     FOR MEDICAL PURPOSES ONLY  COMPREHENSIVE METABOLIC PANEL     Status: Abnormal   Collection Time    08/09/13 11:22 PM      Result Value Ref Range   Sodium 133 (*) 137 - 147 mEq/L   Potassium 3.9  3.7 - 5.3 mEq/L   Chloride 96  96 - 112 mEq/L   CO2 26  19 - 32 mEq/L   Glucose,  Bld 109 (*) 70 - 99 mg/dL   BUN 11  6 - 23 mg/dL   Creatinine, Ser 0.60  0.50 - 1.35 mg/dL   Calcium 8.9  8.4 - 10.5 mg/dL   Total Protein 7.9  6.0 - 8.3 g/dL   Albumin 2.5 (*) 3.5 - 5.2 g/dL   AST 55 (*) 0 - 37 U/L   ALT 40  0 - 53 U/L   Alkaline Phosphatase 140 (*) 39 - 117 U/L   Total Bilirubin 0.5  0.3 - 1.2 mg/dL   GFR calc non Af Amer >90  >90 mL/min   GFR calc Af Amer >90  >90 mL/min   Comment: (NOTE)     The eGFR has been calculated using the CKD EPI equation.     This calculation has not been  validated in all clinical situations.     eGFR's persistently <90 mL/min signify possible Chronic Kidney     Disease.  MAGNESIUM     Status: None   Collection Time    08/09/13 11:22 PM      Result Value Ref Range   Magnesium 1.9  1.5 - 2.5 mg/dL  PHOSPHORUS     Status: None   Collection Time    08/09/13 11:22 PM      Result Value Ref Range   Phosphorus 3.0  2.3 - 4.6 mg/dL  CBC WITH DIFFERENTIAL     Status: Abnormal   Collection Time    08/09/13 11:22 PM      Result Value Ref Range   WBC 12.1 (*) 4.0 - 10.5 K/uL   RBC 3.63 (*) 4.22 - 5.81 MIL/uL   Hemoglobin 10.1 (*) 13.0 - 17.0 g/dL   Comment: DELTA CHECK NOTED     REPEATED TO VERIFY   HCT 30.5 (*) 39.0 - 52.0 %   MCV 84.0  78.0 - 100.0 fL   MCH 27.8  26.0 - 34.0 pg   MCHC 33.1  30.0 - 36.0 g/dL   RDW 14.3  11.5 - 15.5 %   Platelets 389  150 - 400 K/uL   Neutrophils Relative % 79 (*) 43 - 77 %   Neutro Abs 9.5 (*) 1.7 - 7.7 K/uL   Lymphocytes Relative 10 (*) 12 - 46 %   Lymphs Abs 1.2  0.7 - 4.0 K/uL   Monocytes Relative 11  3 - 12 %   Monocytes Absolute 1.3 (*) 0.1 - 1.0 K/uL   Eosinophils Relative 0  0 - 5 %   Eosinophils Absolute 0.0  0.0 - 0.7 K/uL   Basophils Relative 0  0 - 1 %   Basophils Absolute 0.0  0.0 - 0.1 K/uL  APTT     Status: None   Collection Time    08/09/13 11:22 PM      Result Value Ref Range   aPTT 31  24 - 37 seconds  PROTIME-INR     Status: None   Collection Time    08/09/13 11:22 PM      Result Value Ref Range   Prothrombin Time 13.7  11.6 - 15.2 seconds   INR 1.07  0.00 - 1.49  SURGICAL PCR SCREEN     Status: Abnormal   Collection Time    08/09/13 11:27 PM      Result Value Ref Range   MRSA, PCR NEGATIVE  NEGATIVE   Staphylococcus aureus POSITIVE (*) NEGATIVE   Comment:            The Xpert SA  Assay (FDA     approved for NASAL specimens     in patients over 48 years of age),     is one component of     a comprehensive surveillance     program.  Test performance has     been  validated by [employer] for patients greater     than or equal to 33 year old.     It is not intended     to diagnose infection nor to     guide or monitor treatment.  URINE RAPID DRUG SCREEN (HOSP PERFORMED)     Status: None   Collection Time    08/10/13  4:53 AM      Result Value Ref Range   Opiates NONE DETECTED  NONE DETECTED   Cocaine NONE DETECTED  NONE DETECTED   Benzodiazepines NONE DETECTED  NONE DETECTED   Amphetamines NONE DETECTED  NONE DETECTED   Tetrahydrocannabinol NONE DETECTED  NONE DETECTED   Barbiturates NONE DETECTED  NONE DETECTED   Comment:            DRUG SCREEN FOR MEDICAL PURPOSES     ONLY.  IF CONFIRMATION IS NEEDED     FOR ANY PURPOSE, NOTIFY LAB     WITHIN 5 DAYS.                LOWEST DETECTABLE LIMITS     FOR URINE DRUG SCREEN     Drug Class       Cutoff (ng/mL)     Amphetamine      1000     Barbiturate      200     Benzodiazepine   109     Tricyclics       323     Opiates          300     Cocaine          300     THC              50  COMPREHENSIVE METABOLIC PANEL     Status: Abnormal   Collection Time    08/10/13  5:44 AM      Result Value Ref Range   Sodium 135 (*) 137 - 147 mEq/L   Potassium 4.5  3.7 - 5.3 mEq/L   Chloride 99  96 - 112 mEq/L   CO2 26  19 - 32 mEq/L   Glucose, Bld 83  70 - 99 mg/dL   BUN 11  6 - 23 mg/dL   Creatinine, Ser 0.67  0.50 - 1.35 mg/dL   Calcium 8.5  8.4 - 10.5 mg/dL   Total Protein 7.4  6.0 - 8.3 g/dL   Albumin 2.3 (*) 3.5 - 5.2 g/dL   AST 50 (*) 0 - 37 U/L   ALT 37  0 - 53 U/L   Alkaline Phosphatase 137 (*) 39 - 117 U/L   Total Bilirubin 0.4  0.3 - 1.2 mg/dL   GFR calc non Af Amer >90  >90 mL/min   GFR calc Af Amer >90  >90 mL/min   Comment: (NOTE)     The eGFR has been calculated using the CKD EPI equation.     This calculation has not been validated in all clinical situations.     eGFR's persistently <90 mL/min signify possible Chronic Kidney     Disease.  CBC     Status: Abnormal    Collection Time  08/10/13  5:44 AM      Result Value Ref Range   WBC 11.7 (*) 4.0 - 10.5 K/uL   RBC 3.49 (*) 4.22 - 5.81 MIL/uL   Hemoglobin 9.7 (*) 13.0 - 17.0 g/dL   HCT 29.2 (*) 39.0 - 52.0 %   MCV 83.7  78.0 - 100.0 fL   MCH 27.8  26.0 - 34.0 pg   MCHC 33.2  30.0 - 36.0 g/dL   RDW 14.5  11.5 - 15.5 %   Platelets 397  150 - 400 K/uL  PROTIME-INR     Status: None   Collection Time    08/10/13  5:44 AM      Result Value Ref Range   Prothrombin Time 13.8  11.6 - 15.2 seconds   INR 1.08  0.00 - 1.49  APTT     Status: None   Collection Time    08/10/13  5:44 AM      Result Value Ref Range   aPTT 33  24 - 37 seconds  GLUCOSE, CAPILLARY     Status: None   Collection Time    08/10/13  7:36 AM      Result Value Ref Range   Glucose-Capillary 92  70 - 99 mg/dL   US Abdomen Complete  08/09/2013   CLINICAL DATA:  Abnormal liver function tests.  Liver abscess.  EXAM: ULTRASOUND ABDOMEN COMPLETE  COMPARISON:  CT abdomen 08/09/2013  FINDINGS: Gallbladder:  No gallstones or wall thickening visualized. No sonographic Murphy sign noted.  Common bile duct:  Diameter: 3 mm  Liver:  As seen on recent CT, there is a large, complex collection involving both right and left hepatic lobes measuring approximately 13.9 x 8.3 x 10.6 cm with some mild peripheral vascularity. Liver was grossly normal in echogenicity elsewhere. No gross intrahepatic biliary dilatation identified.  IVC:  No abnormality visualized.  Pancreas:  Not well visualized.  Spleen:  Size and appearance within normal limits.  Right Kidney:  Length: 13.0 cm. Echogenicity within normal limits. No mass or hydronephrosis visualized.  Left Kidney:  Length: 12.7 cm. Echogenicity within normal limits. No mass or hydronephrosis visualized.  Abdominal aorta:  No aneurysm visualized.  Other findings:  None.  IMPRESSION: Large, complex collection in the liver, consistent with previously described abscess.   Electronically Signed   By: Logan Bores   On:  08/09/2013 23:06   Ct Abd Wo & W Cm  08/09/2013   CLINICAL DATA:  History of liver abscess.  Follow-up.  EXAM: CT ABDOMEN WITHOUT AND WITH CONTRAST  TECHNIQUE: Multidetector CT imaging of the abdomen was performed following the standard protocol before and following the bolus administration of intravenous contrast.  CONTRAST:  15m OMNIPAQUE IOHEXOL 300 MG/ML  SOLN  COMPARISON:  06/14/2013  FINDINGS: Large complex fluid collection noted within the medial segment of the left hepatic lobe and extending into the right hepatic lobe. This has increased in size since prior study. This currently measures approximately 14.3 x 7.5 cm on image 22 of series 8. This previously measured 7.4 x 5.9 cm. This is compatible with worsening hepatic abscess. There is probable extrahepatic extension anteriorly with abnormal soft tissue in fluid extending anteriorly to the anterior abdominal wall. The overlying rectus muscle is thickened and possibly involved.  Other small low-density lesions within the liver are stable and compatible with cysts. Spleen, pancreas, adrenals and kidneys are normal. Visualized large and small bowel unremarkable. Stomach grossly unremarkable.  Airspace opacity is noted within the right  lung base, most pronounced in the right middle lobe. This may reflect atelectasis although pneumonia cannot be completely excluded. There are enlarged just subdiaphragmatic lymph nodes anteriorly. These are similar to prior study. Index node anterior to the liver margin has a short axis diameter of 7 mm compared with 10 mm previously.  Aorta is normal caliber. Shotty retroperitoneal lymph nodes which are not pathologically enlarged.  No acute bony abnormality.  IMPRESSION: Significant enlargement of the complex fluid collection in the anterior liver, likely not extending from the left hepatic lobe into the right hepatic lobe. This also appears to extend extrahepatic to the anterior abdominal wall with thickening of the  right rectus muscle. Findings compatible with worsening hepatic abscess.  Airspace disease in the adjacent right lung base/right middle lobe. This could reflect atelectasis or pneumonia.  Continued enlarged juxta diaphragmatic lymph nodes.  Critical Value/emergent results were called by telephone at the time of interpretation on 08/09/2013 at 1:20PM to Dr. Jill Alexanders , who verbally acknowledged these results.   Electronically Signed   By: Rolm Baptise M.D.   On: 08/09/2013 13:37    Assessment/Plan Right upper quadrant abdominal pain Elevated LFT's Leukocytosis, trending down. 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 All of the patient's questions were answered, patient is agreeable to proceed. Consent signed and in chart.   Tsosie Billing D PA-C 08/10/2013, 12:27 PM

## 2013-08-10 NOTE — Progress Notes (Signed)
INITIAL NUTRITION ASSESSMENT  DOCUMENTATION CODES Per approved criteria  -Not Applicable   INTERVENTION: Add vanilla Ensure Complete PO daily. Recommend diet liberalization to Regular. RD to continue to follow nutrition care plan.  NUTRITION DIAGNOSIS: Unintentional wt loss related to recent illness and poor appetite as evidenced by 16% wt loss x 2 months.  Goal: Intake to meet >90% of estimated nutrition needs.  Monitor:  weight trends, lab trends, I/O's, PO intake, supplement tolerance  Reason for Assessment: Malnutrition Screening Tool  58 y.o. male  Admitting Dx: Liver abscess  ASSESSMENT: No significant PMH. Admitted with R mid abdominal pain. Work-up reveals liver abscess.  CT abdomen revealed significant enlargement of the complex fluid collection in the anterior liver, findings compatible with worsening hepatic abscess. Pt also with airspace disease in the adjacent right lung base/right middle lobe which could reflect atelectasis or pneumonia.   Pt with 16% wt loss x 2-3 months. He states that all of this weight loss occurred in 1 month and he is now eating 100%. Confirmed with tech. Pt is asking for more food, agreeable to Ensure daily to help provide more kcal/protein.  Underwent CT-guided drainage of hepatic abscess on 2/14.  Potassium, magnesium and phosphorus WNL.  Height: Ht Readings from Last 1 Encounters:  08/09/13 5\' 9"  (1.753 m)    Weight: Wt Readings from Last 1 Encounters:  08/10/13 152 lb 12.5 oz (69.3 kg)    Ideal Body Weight: 160 lb  % Ideal Body Weight: 95%  Wt Readings from Last 10 Encounters:  08/10/13 152 lb 12.5 oz (69.3 kg)  08/05/13 157 lb (71.215 kg)  07/01/13 169 lb (76.658 kg)  06/17/13 180 lb (81.647 kg)  06/07/13 180 lb (81.647 kg)  06/06/11 186 lb (84.369 kg)  08/28/09 193 lb (87.544 kg)    Usual Body Weight: 180 lb  % Usual Body Weight: 84%  BMI:  Body mass index is 22.55 kg/(m^2). Normal weight  Estimated  Nutritional Needs: Kcal: 1750 - 1950 Protein: 75 - 85 g Fluid: 1.8 - 2 liters daily  Skin: intact  Diet Order: Cardiac  EDUCATION NEEDS: -No education needs identified at this time   Intake/Output Summary (Last 24 hours) at 08/10/13 1631 Last data filed at 08/10/13 1418  Gross per 24 hour  Intake 1689.25 ml  Output    220 ml  Net 1469.25 ml    Last BM: 2/13  Labs:   Recent Labs Lab 08/09/13 1957 08/09/13 2010 08/09/13 2322 08/10/13 0544  NA 136* 137 133* 135*  K 4.0 3.8 3.9 4.5  CL 98 98 96 99  CO2 26  --  26 26  BUN 11 9 11 11   CREATININE 0.59 0.60 0.60 0.67  CALCIUM 9.3  --  8.9 8.5  MG  --   --  1.9  --   PHOS  --   --  3.0  --   GLUCOSE 87 88 109* 83    CBG (last 3)   Recent Labs  08/10/13 0736  GLUCAP 92    Scheduled Meds: . Chlorhexidine Gluconate Cloth  6 each Topical Daily  . ciprofloxacin  400 mg Intravenous Q12H  . fentaNYL      . heparin subcutaneous  5,000 Units Subcutaneous 3 times per day  . lidocaine      . metronidazole  500 mg Intravenous Q8H  . midazolam      . mupirocin ointment  1 application Nasal BID    Continuous Infusions: . sodium chloride 75 mL/hr  at 08/10/13 1447    Past Medical History  Diagnosis Date  . Allergy     RHINITIS  . ED (erectile dysfunction)   . Congenital anomalies of the testes   . Medical history non-contributory     History reviewed. No pertinent past surgical history.  Jarold Motto MS, RD, LDN Inpatient Registered Dietitian Pager: [phone] After-hours pager: [phone]

## 2013-08-10 NOTE — Procedures (Signed)
Procedure:  CT guided drainage of hepatic abscess Findings:  Grossly purulent material aspirated.  Sample sent for culture.  12 Fr perc drain placed and attached to suction bulb.

## 2013-08-10 NOTE — Progress Notes (Signed)
Patient Demographics  Aaron Leonard, is a 58 y.o. male, DOB - 09/08/1955, YNW:[id number]  Admit date - 08/09/2013   Admitting Physician Dorothea OgleIskra M Myers, MD  Outpatient Primary MD for the patient is [name],Aaron CHARLES, MD  LOS - 1   Chief Complaint  Patient presents with  . Follow-up        Assessment & Plan    1. Right upper quadrant abdominal pain with elevated LFTs secondary to liver abscess - unclear source, no history of travel to outside this country, no history of colitis or GI problems. Have ordered blood cultures. Continue IV Cipro and Flagyl, have called GI to look into any further workup if needed. General surgery following. A general surgery higher has been requested to guide the abscess under CT imaging, will send the fluid for Gram stain and culture.   2. Mild anemia of chronic disease. Stable outpatient monitoring.   3. Hyponatremia mild secondary to dehydration, improving with IV fluids.     Code Status: Full  Family Communication:    Disposition Plan: Home   Procedures right upper quadrant ultrasound, CT guided drainage of her liver abscess, CT scan abdomen pelvis   Consults  surgery, IR, GI   Medications  Scheduled Meds: . Chlorhexidine Gluconate Cloth  6 each Topical Daily  . ciprofloxacin  400 mg Intravenous Q12H  . heparin subcutaneous  5,000 Units Subcutaneous 3 times per day  . metronidazole  500 mg Intravenous Q8H  . mupirocin ointment  1 application Nasal BID   Continuous Infusions:  PRN Meds:.acetaminophen, acetaminophen, HYDROcodone-acetaminophen, morphine injection, ondansetron (ZOFRAN) IV, ondansetron  DVT Prophylaxis    Heparin   Lab Results  Component Value Date   PLT 397 08/10/2013    Antibiotics     Anti-infectives   Start     Dose/Rate  Route Frequency Ordered Stop   08/09/13 2145  ciprofloxacin (CIPRO) IVPB 400 mg     400 mg 200 mL/hr over 60 Minutes Intravenous Every 12 hours 08/09/13 2130     08/09/13 2145  metroNIDAZOLE (FLAGYL) IVPB 500 mg     500 mg 100 mL/hr over 60 Minutes Intravenous Every 8 hours 08/09/13 2130     08/09/13 2045  metroNIDAZOLE (FLAGYL) IVPB 500 mg     500 mg 100 mL/hr over 60 Minutes Intravenous  Once 08/09/13 2039 08/09/13 2212   08/09/13 2045  ciprofloxacin (CIPRO) IVPB 400 mg  Status:  Discontinued     400 mg 200 mL/hr over 60 Minutes Intravenous  Once 08/09/13 2039 08/09/13 2222          Subjective:   Aaron ChardPaul Kocsis today has, No headache, No chest pain, mild right upper quadrant abdominal pain - No Nausea, No new weakness tingling or numbness, No Cough - SOB.    Objective:   Filed Vitals:   08/09/13 2258 08/09/13 2311 08/10/13 0538 08/10/13 0745  BP:  114/74 134/85   Pulse:  93 96   Temp:  98.5 F (36.9 C) 99.5 F (37.5 C)   TempSrc:  Oral Oral   Resp:  18 18   Height: 5\' 9"  (1.753 m)     Weight: 69.3 kg (152 lb 12.5 oz)  69.3 kg (152 lb 12.5 oz)   SpO2:  100% 100% 100%    Wt Readings from Last 3 Encounters:  08/10/13 69.3 kg (152 lb 12.5 oz)  08/05/13 71.215 kg (157 lb)  07/01/13 76.658 kg (169 lb)     Intake/Output Summary (Last 24 hours) at 08/10/13 1138 Last data filed at 08/10/13 0745  Gross per 24 hour  Intake 1689.25 ml  Output    200 ml  Net 1489.25 ml     Physical Exam  Awake Alert, Oriented X 3, No new F.N deficits, Normal affect [place].AT,PERRAL Supple Neck,No JVD, No cervical lymphadenopathy appriciated.  Symmetrical Chest wall movement, Good air movement bilaterally, CTAB RRR,No Gallops,Rubs or new Murmurs, No Parasternal Heave +ve B.Sounds, Abd Soft, mild tenderness in the right upper quadrant, No organomegaly appriciated, No rebound - guarding or rigidity. No Cyanosis, Clubbing or edema, No new Rash or bruise      Data Review   Micro  Results Recent Results (from the past 240 hour(s))  SURGICAL PCR SCREEN     Status: Abnormal   Collection Time    08/09/13 11:27 PM      Result Value Ref Range Status   MRSA, PCR NEGATIVE  NEGATIVE Final   Staphylococcus aureus POSITIVE (*) NEGATIVE Final   Comment:            The Xpert SA Assay (FDA     approved for NASAL specimens     in patients over 57 years of age),     is one component of     a comprehensive surveillance     program.  Test performance has     been validated by [employer] for patients greater     than or equal to 26 year old.     It is not intended     to diagnose infection nor to     guide or monitor treatment.    Radiology Reports US Abdomen Complete  08/09/2013   CLINICAL DATA:  Abnormal liver function tests.  Liver abscess.  EXAM: ULTRASOUND ABDOMEN COMPLETE  COMPARISON:  CT abdomen 08/09/2013  FINDINGS: Gallbladder:  No gallstones or wall thickening visualized. No sonographic Murphy sign noted.  Common bile duct:  Diameter: 3 mm  Liver:  As seen on recent CT, there is a large, complex collection involving both right and left hepatic lobes measuring approximately 13.9 x 8.3 x 10.6 cm with some mild peripheral vascularity. Liver was grossly normal in echogenicity elsewhere. No gross intrahepatic biliary dilatation identified.  IVC:  No abnormality visualized.  Pancreas:  Not well visualized.  Spleen:  Size and appearance within normal limits.  Right Kidney:  Length: 13.0 cm. Echogenicity within normal limits. No mass or hydronephrosis visualized.  Left Kidney:  Length: 12.7 cm. Echogenicity within normal limits. No mass or hydronephrosis visualized.  Abdominal aorta:  No aneurysm visualized.  Other findings:  None.  IMPRESSION: Large, complex collection in the liver, consistent with previously described abscess.   Electronically Signed   By: Sebastian Ache   On: 08/09/2013 23:06   Ct Abd Wo & W Cm  08/09/2013   CLINICAL DATA:  History of liver abscess.   Follow-up.  EXAM: CT ABDOMEN WITHOUT AND WITH CONTRAST  TECHNIQUE: Multidetector CT imaging of the abdomen was performed following the standard protocol before and following the bolus administration of intravenous contrast.  CONTRAST:  OMNIPAQUE IOHEXOL 300 MG/ML  SOLN  COMPARISON:  06/14/2013  FINDINGS: Large complex fluid collection noted within the medial segment of the left  hepatic lobe and extending into the right hepatic lobe. This has increased in size since prior study. This currently measures approximately 14.3 x 7.5 cm on image 22 of series 8. This previously measured 7.4 x 5.9 cm. This is compatible with worsening hepatic abscess. There is probable extrahepatic extension anteriorly with abnormal soft tissue in fluid extending anteriorly to the anterior abdominal wall. The overlying rectus muscle is thickened and possibly involved.  Other small low-density lesions within the liver are stable and compatible with cysts. Spleen, pancreas, adrenals and kidneys are normal. Visualized large and small bowel unremarkable. Stomach grossly unremarkable.  Airspace opacity is noted within the right lung base, most pronounced in the right middle lobe. This may reflect atelectasis although pneumonia cannot be completely excluded. There are enlarged just subdiaphragmatic lymph nodes anteriorly. These are similar to prior study. Index node anterior to the liver margin has a short axis diameter of 7 mm compared with 10 mm previously.  Aorta is normal caliber. Shotty retroperitoneal lymph nodes which are not pathologically enlarged.  No acute bony abnormality.  IMPRESSION: Significant enlargement of the complex fluid collection in the anterior liver, likely not extending from the left hepatic lobe into the right hepatic lobe. This also appears to extend extrahepatic to the anterior abdominal wall with thickening of the right rectus muscle. Findings compatible with worsening hepatic abscess.  Airspace disease in the  adjacent right lung base/right middle lobe. This could reflect atelectasis or pneumonia.  Continued enlarged juxta diaphragmatic lymph nodes.  Critical Value/emergent results were called by telephone at the time of interpretation on 08/09/2013 at 1:20PM to Dr. Sharlot Gowda , who verbally acknowledged these results.   Electronically Signed   By: Charlett Nose M.D.   On: 08/09/2013 13:37    CBC  Recent Labs Lab 08/05/13 1158 08/09/13 1957 08/09/13 2010 08/09/13 2322 08/10/13 0544  WBC 14.0* 14.0*  --  12.1* 11.7*  HGB 10.4* 10.9* 12.6* 10.1* 9.7*  HCT 30.9* 32.7* 37.0* 30.5* 29.2*  PLT 504* 457*  --  389 397  MCV 80.9 84.7  --  84.0 83.7  MCH 27.2 28.2  --  27.8 27.8  MCHC 33.7 33.3  --  33.1 33.2  RDW 14.3 14.6  --  14.3 14.5  LYMPHSABS 2.2 1.7  --  1.2  --   MONOABS 1.3* 1.5*  --  1.3*  --   EOSABS 0.0 0.1  --  0.0  --   BASOSABS 0.0 0.0  --  0.0  --     Chemistries   Recent Labs Lab 08/05/13 1158 08/09/13 1957 08/09/13 2010 08/09/13 2322 08/10/13 0544  NA 133* 136* 137 133* 135*  K 4.1 4.0 3.8 3.9 4.5  CL 100 98 98 96 99  CO2 26 26  --  26 26  GLUCOSE 95 87 88 109* 83  BUN 9 11 9 11 11   CREATININE 0.63 0.59 0.60 0.60 0.67  CALCIUM 8.8 9.3  --  8.9 8.5  MG  --   --   --  1.9  --   AST 44* 62*  --  55* 50*  ALT 32 45  --  40 37  ALKPHOS 103 159*  --  140* 137*  BILITOT 0.6 0.6  --  0.5 0.4   ------------------------------------------------------------------------------------------------------------------ estimated creatinine clearance is 98.7 ml/min (by C-G formula based on Cr of 0.67). ------------------------------------------------------------------------------------------------------------------ No results found for this basename: HGBA1C,  in the last 72 hours ------------------------------------------------------------------------------------------------------------------ No results found for this basename: CHOL,  HDL, LDLCALC, TRIG, CHOLHDL, LDLDIRECT,  in the  last 72 hours ------------------------------------------------------------------------------------------------------------------ No results found for this basename: TSH, T4TOTAL, FREET3, T3FREE, THYROIDAB,  in the last 72 hours ------------------------------------------------------------------------------------------------------------------ No results found for this basename: VITAMINB12, FOLATE, FERRITIN, TIBC, IRON, RETICCTPCT,  in the last 72 hours  Coagulation profile  Recent Labs Lab 08/09/13 2009 08/09/13 2322 08/10/13 0544  INR 1.06 1.07 1.08    No results found for this basename: DDIMER,  in the last 72 hours  Cardiac Enzymes No results found for this basename: CK, CKMB, TROPONINI, MYOGLOBIN,  in the last 168 hours ------------------------------------------------------------------------------------------------------------------ No components found with this basename: POCBNP,      Time Spent in minutes   35   Susa Raring K M.D on 08/10/2013 at 11:38 AM  Between 7am to 7pm - Pager - [phone]  After 7pm go to [email or web address] - password [email or web address]  And look for the night coverage person covering for me after hours  [facility] Office  [phone]

## 2013-08-11 LAB — CBC
HCT: 29.8 % — ABNORMAL LOW (ref 39.0–52.0)
Hemoglobin: 9.7 g/dL — ABNORMAL LOW (ref 13.0–17.0)
MCH: 27.5 pg (ref 26.0–34.0)
MCHC: 32.6 g/dL (ref 30.0–36.0)
MCV: 84.4 fL (ref 78.0–100.0)
PLATELETS: 416 10*3/uL — AB (ref 150–400)
RBC: 3.53 MIL/uL — AB (ref 4.22–5.81)
RDW: 14.6 % (ref 11.5–15.5)
WBC: 10.4 10*3/uL (ref 4.0–10.5)

## 2013-08-11 LAB — GLUCOSE, CAPILLARY: Glucose-Capillary: 88 mg/dL (ref 70–99)

## 2013-08-11 LAB — COMPREHENSIVE METABOLIC PANEL
ALK PHOS: 122 U/L — AB (ref 39–117)
ALT: 30 U/L (ref 0–53)
AST: 40 U/L — AB (ref 0–37)
Albumin: 2.2 g/dL — ABNORMAL LOW (ref 3.5–5.2)
BILIRUBIN TOTAL: 0.4 mg/dL (ref 0.3–1.2)
BUN: 9 mg/dL (ref 6–23)
CHLORIDE: 102 meq/L (ref 96–112)
CO2: 26 meq/L (ref 19–32)
CREATININE: 0.62 mg/dL (ref 0.50–1.35)
Calcium: 8.7 mg/dL (ref 8.4–10.5)
GFR calc Af Amer: >90 mL/min (ref >90)
Glucose, Bld: 87 mg/dL (ref 70–99)
POTASSIUM: 4.6 meq/L (ref 3.7–5.3)
Sodium: 138 mEq/L (ref 137–147)
Total Protein: 7.2 g/dL (ref 6.0–8.3)

## 2013-08-11 NOTE — Progress Notes (Signed)
Utilization Review Completed.Aaron Leonard T2/15/2015  

## 2013-08-11 NOTE — Progress Notes (Addendum)
Patient Demographics  Aaron Leonard, is a 58 y.o. male, DOB - 09/25/1955, [id number]  Admit date - 08/09/2013   Admitting Physician Dorothea OgleIskra M Myers, MD  Outpatient Primary MD for the patient is [name],[name], MD  LOS - 2   Chief Complaint  Patient presents with  . Follow-up        Assessment & Plan    1. Right upper quadrant abdominal pain with elevated LFTs secondary to liver abscess (2nd episode)  - unclear source, no history of travel to outside this country, no history of colitis or GI problems. Have ordered blood cultures. Continue IV Cipro and Flagyl, have called GI to look into any further workup if needed. Post placement of drainage catheter under CT guidance by interventional radiology on 08/10/2013, currently feeling better, follow culture results. GI on board. CCS Dr Derrell Lollingamirez called upon admission await input.     2. Mild anemia of chronic disease. Stable outpatient monitoring.      3. Hyponatremia mild secondary to dehydration -  improving with IV fluids.     Code Status: Full  Family Communication:    Disposition Plan: Home   Procedures right upper quadrant ultrasound, CT guided drainage of her liver abscess, CT scan abdomen pelvis   Consults  IR, GI, CCS called upon admission   Medications  Scheduled Meds: . Chlorhexidine Gluconate Cloth  6 each Topical Daily  . ciprofloxacin  400 mg Intravenous Q12H  . feeding supplement (ENSURE COMPLETE)  237 mL Oral Q24H  . heparin subcutaneous  5,000 Units Subcutaneous 3 times per day  . metronidazole  500 mg Intravenous Q8H  . mupirocin ointment  1 application Nasal BID   Continuous Infusions: . sodium chloride 75 mL/hr at 08/11/13 0517   PRN Meds:.acetaminophen, acetaminophen, HYDROcodone-acetaminophen,  morphine injection, ondansetron (ZOFRAN) IV, ondansetron  DVT Prophylaxis    Heparin   Lab Results  Component Value Date   PLT 416* 08/11/2013    Antibiotics     Anti-infectives   Start     Dose/Rate Route Frequency Ordered Stop   08/09/13 2145  ciprofloxacin (CIPRO) IVPB 400 mg     400 mg 200 mL/hr over 60 Minutes Intravenous Every 12 hours 08/09/13 2130     08/09/13 2145  metroNIDAZOLE (FLAGYL) IVPB 500 mg     500 mg 100 mL/hr over 60 Minutes Intravenous Every 8 hours 08/09/13 2130     08/09/13 2045  metroNIDAZOLE (FLAGYL) IVPB 500 mg     500 mg 100 mL/hr over 60 Minutes Intravenous  Once 08/09/13 2039 08/09/13 2212   08/09/13 2045  ciprofloxacin (CIPRO) IVPB 400 mg  Status:  Discontinued     400 mg 200 mL/hr over 60 Minutes Intravenous  Once 08/09/13 2039 08/09/13 2222          Subjective:   Aaron Leonard today has, No headache, No chest pain, mild right upper quadrant abdominal pain - No Nausea, No new weakness tingling or numbness, No Cough - SOB.    Objective:   Filed Vitals:   08/10/13 1707 08/10/13 1755 08/10/13 2210 08/11/13 0516  BP: 135/85 125/76 119/72 123/80  Pulse: 90 97 86 74  Temp: 99.3 F (37.4 C) 99.7 F (37.6 C) 100.1 F (37.8 C) 98.5  F (36.9 C)  TempSrc: Oral Oral Oral Oral  Resp: 18 18 17 17   Height:      Weight:    67.586 kg (149 lb)  SpO2: 96% 95% 98% 99%    Wt Readings from Last 3 Encounters:  08/11/13 67.586 kg (149 lb)  08/05/13 71.215 kg (157 lb)  07/01/13 76.658 kg (169 lb)     Intake/Output Summary (Last 24 hours) at 08/11/13 0854 Last data filed at 08/11/13 0715  Gross per 24 hour  Intake 2186.25 ml  Output    295 ml  Net 1891.25 ml     Physical Exam  Awake Alert, Oriented X 3, No new F.N deficits, Normal affect [place].AT,PERRAL Supple Neck,No JVD, No cervical lymphadenopathy appriciated.  Symmetrical Chest wall movement, Good air movement bilaterally, CTAB RRR,No Gallops,Rubs or new Murmurs, No Parasternal Heave +ve  B.Sounds, Abd Soft, mild tenderness in the right upper quadrant, hepatic drain in place, No organomegaly appriciated, No rebound - guarding or rigidity. No Cyanosis, Clubbing or edema, No new Rash or bruise      Data Review   Micro Results Recent Results (from the past 240 hour(s))  SURGICAL PCR SCREEN     Status: Abnormal   Collection Time    08/09/13 11:27 PM      Result Value Ref Range Status   MRSA, PCR NEGATIVE  NEGATIVE Final   Staphylococcus aureus POSITIVE (*) NEGATIVE Final   Comment:            The Xpert SA Assay (FDA     approved for NASAL specimens     in patients over 12 years of age),     is one component of     a comprehensive surveillance     program.  Test performance has     been validated by [employer] for patients greater     than or equal to 25 year old.     It is not intended     to diagnose infection nor to     guide or monitor treatment.    Radiology Reports US Abdomen Complete  08/09/2013   CLINICAL DATA:  Abnormal liver function tests.  Liver abscess.  EXAM: ULTRASOUND ABDOMEN COMPLETE  COMPARISON:  CT abdomen 08/09/2013  FINDINGS: Gallbladder:  No gallstones or wall thickening visualized. No sonographic Murphy sign noted.  Common bile duct:  Diameter: 3 mm  Liver:  As seen on recent CT, there is a large, complex collection involving both right and left hepatic lobes measuring approximately 13.9 x 8.3 x 10.6 cm with some mild peripheral vascularity. Liver was grossly normal in echogenicity elsewhere. No gross intrahepatic biliary dilatation identified.  IVC:  No abnormality visualized.  Pancreas:  Not well visualized.  Spleen:  Size and appearance within normal limits.  Right Kidney:  Length: 13.0 cm. Echogenicity within normal limits. No mass or hydronephrosis visualized.  Left Kidney:  Length: 12.7 cm. Echogenicity within normal limits. No mass or hydronephrosis visualized.  Abdominal aorta:  No aneurysm visualized.  Other findings:  None.  IMPRESSION:  Large, complex collection in the liver, consistent with previously described abscess.   Electronically Signed   By: Sebastian Ache   On: 08/09/2013 23:06   Ct Abd Wo & W Cm  08/09/2013   CLINICAL DATA:  History of liver abscess.  Follow-up.  EXAM: CT ABDOMEN WITHOUT AND WITH CONTRAST  TECHNIQUE: Multidetector CT imaging of the abdomen was performed following the standard protocol before and  following the bolus administration of intravenous contrast.  CONTRAST:  OMNIPAQUE IOHEXOL 300 MG/ML  SOLN  COMPARISON:  06/14/2013  FINDINGS: Large complex fluid collection noted within the medial segment of the left hepatic lobe and extending into the right hepatic lobe. This has increased in size since prior study. This currently measures approximately 14.3 x 7.5 cm on image 22 of series 8. This previously measured 7.4 x 5.9 cm. This is compatible with worsening hepatic abscess. There is probable extrahepatic extension anteriorly with abnormal soft tissue in fluid extending anteriorly to the anterior abdominal wall. The overlying rectus muscle is thickened and possibly involved.  Other small low-density lesions within the liver are stable and compatible with cysts. Spleen, pancreas, adrenals and kidneys are normal. Visualized large and small bowel unremarkable. Stomach grossly unremarkable.  Airspace opacity is noted within the right lung base, most pronounced in the right middle lobe. This may reflect atelectasis although pneumonia cannot be completely excluded. There are enlarged just subdiaphragmatic lymph nodes anteriorly. These are similar to prior study. Index node anterior to the liver margin has a short axis diameter of 7 mm compared with 10 mm previously.  Aorta is normal caliber. Shotty retroperitoneal lymph nodes which are not pathologically enlarged.  No acute bony abnormality.  IMPRESSION: Significant enlargement of the complex fluid collection in the anterior liver, likely not extending from the left  hepatic lobe into the right hepatic lobe. This also appears to extend extrahepatic to the anterior abdominal wall with thickening of the right rectus muscle. Findings compatible with worsening hepatic abscess.  Airspace disease in the adjacent right lung base/right middle lobe. This could reflect atelectasis or pneumonia.  Continued enlarged juxta diaphragmatic lymph nodes.  Critical Value/emergent results were called by telephone at the time of interpretation on 08/09/2013 at 1:20PM to Dr. Sharlot Gowda , who verbally acknowledged these results.   Electronically Signed   By: Charlett Nose M.D.   On: 08/09/2013 13:37    CBC  Recent Labs Lab 08/05/13 1158 08/09/13 1957 08/09/13 2010 08/09/13 2322 08/10/13 0544 08/11/13 0710  WBC 14.0* 14.0*  --  12.1* 11.7* 10.4  HGB 10.4* 10.9* 12.6* 10.1* 9.7* 9.7*  HCT 30.9* 32.7* 37.0* 30.5* 29.2* 29.8*  PLT 504* 457*  --  389 397 416*  MCV 80.9 84.7  --  84.0 83.7 84.4  MCH 27.2 28.2  --  27.8 27.8 27.5  MCHC 33.7 33.3  --  33.1 33.2 32.6  RDW 14.3 14.6  --  14.3 14.5 14.6  LYMPHSABS 2.2 1.7  --  1.2  --   --   MONOABS 1.3* 1.5*  --  1.3*  --   --   EOSABS 0.0 0.1  --  0.0  --   --   BASOSABS 0.0 0.0  --  0.0  --   --     Chemistries   Recent Labs Lab 08/05/13 1158 08/09/13 1957 08/09/13 2010 08/09/13 2322 08/10/13 0544 08/11/13 0710  NA 133* 136* 137 133* 135* 138  K 4.1 4.0 3.8 3.9 4.5 4.6  CL 100 98 98 96 99 102  CO2 26 26  --  26 26 26   GLUCOSE 95 87 88 109* 83 87  BUN 9 11 9 11 11 9   CREATININE 0.63 0.59 0.60 0.60 0.67 0.62  CALCIUM 8.8 9.3  --  8.9 8.5 8.7  MG  --   --   --  1.9  --   --   AST 44* 62*  --  55* 50* 40*  ALT 32 45  --  40 37 30  ALKPHOS 103 159*  --  140* 137* 122*  BILITOT 0.6 0.6  --  0.5 0.4 0.4   ------------------------------------------------------------------------------------------------------------------ estimated creatinine clearance is 96.2 ml/min (by C-G formula based on Cr of  0.62). ------------------------------------------------------------------------------------------------------------------ No results found for this basename: HGBA1C,  in the last 72 hours ------------------------------------------------------------------------------------------------------------------ No results found for this basename: CHOL, HDL, LDLCALC, TRIG, CHOLHDL, LDLDIRECT,  in the last 72 hours ------------------------------------------------------------------------------------------------------------------  Recent Labs  08/09/13 2322  TSH 0.786   ------------------------------------------------------------------------------------------------------------------ No results found for this basename: VITAMINB12, FOLATE, FERRITIN, TIBC, IRON, RETICCTPCT,  in the last 72 hours  Coagulation profile  Recent Labs Lab 08/09/13 2009 08/09/13 2322 08/10/13 0544  INR 1.06 1.07 1.08    No results found for this basename: DDIMER,  in the last 72 hours  Cardiac Enzymes No results found for this basename: CK, CKMB, TROPONINI, MYOGLOBIN,  in the last 168 hours ------------------------------------------------------------------------------------------------------------------ No components found with this basename: POCBNP,      Time Spent in minutes   35   Saxton Chain K M.D on 08/11/2013 at 8:54 AM  Between 7am to 7pm - Pager - [phone]  After 7pm go to [email or web address] - password [email or web address]  And look for the night coverage person covering for me after hours  [facility] Office  [phone]

## 2013-08-11 NOTE — Progress Notes (Signed)
Subjective: Patient states he is feeling better, denies any pain, fever or chills. S/p RUQ hepatic abscess drain placement.   Objective: Physical Exam: BP 123/80  Pulse 74  Temp(Src) 98.5 F (36.9 C) (Oral)  Resp 17  Ht 5\' 9"  (1.753 m)  Wt 149 lb (67.586 kg)  BMI 21.99 kg/m2  SpO2 99%  General: A&Ox3, NAD, sitting in bed.  Abd: Soft, NT, (+) BS, RUQ drain dressing C/D/I, no signs of bleeding, leakage or hematoma, Output 24 hour 220cc, currently approximately 100cc purulent tan fluid in JP Bulb.  Labs: CBC  Recent Labs  08/10/13 0544 08/11/13 0710  WBC 11.7* 10.4  HGB 9.7* 9.7*  HCT 29.2* 29.8*  PLT 397 416*   BMET  Recent Labs  08/10/13 0544 08/11/13 0710  NA 135* 138  K 4.5 4.6  CL 99 102  CO2 26 26  GLUCOSE 83 87  BUN 11 9  CREATININE 0.67 0.62  CALCIUM 8.5 8.7   LFT  Recent Labs  08/11/13 0710  PROT 7.2  ALBUMIN 2.2*  AST 40*  ALT 30  ALKPHOS 122*  BILITOT 0.4   PT/INR  Recent Labs  08/09/13 2322 08/10/13 0544  LABPROT 13.7 13.8  INR 1.07 1.08     Studies/Results: [place] Abdomen Complete  08/09/2013   CLINICAL DATA:  Abnormal liver function tests.  Liver abscess.  EXAM: ULTRASOUND ABDOMEN COMPLETE  COMPARISON:  CT abdomen 08/09/2013  FINDINGS: Gallbladder:  No gallstones or wall thickening visualized. No sonographic Murphy sign noted.  Common bile duct:  Diameter: 3 mm  Liver:  As seen on recent CT, there is a large, complex collection involving both right and left hepatic lobes measuring approximately 13.9 x 8.3 x 10.6 cm with some mild peripheral vascularity. Liver was grossly normal in echogenicity elsewhere. No gross intrahepatic biliary dilatation identified.  IVC:  No abnormality visualized.  Pancreas:  Not well visualized.  Spleen:  Size and appearance within normal limits.  Right Kidney:  Length: 13.0 cm. Echogenicity within normal limits. No mass or hydronephrosis visualized.  Left Kidney:  Length: 12.7 cm. Echogenicity within normal  limits. No mass or hydronephrosis visualized.  Abdominal aorta:  No aneurysm visualized.  Other findings:  None.  IMPRESSION: Large, complex collection in the liver, consistent with previously described abscess.   Electronically Signed   By: Sebastian AcheAllen  Grady   On: 08/09/2013 23:06   Ct Abd Wo & W Cm  08/09/2013   CLINICAL DATA:  History of liver abscess.  Follow-up.  EXAM: CT ABDOMEN WITHOUT AND WITH CONTRAST  TECHNIQUE: Multidetector CT imaging of the abdomen was performed following the standard protocol before and following the bolus administration of intravenous contrast.  CONTRAST:  100mL OMNIPAQUE IOHEXOL 300 MG/ML  SOLN  COMPARISON:  06/14/2013  FINDINGS: Large complex fluid collection noted within the medial segment of the left hepatic lobe and extending into the right hepatic lobe. This has increased in size since prior study. This currently measures approximately 14.3 x 7.5 cm on image 22 of series 8. This previously measured 7.4 x 5.9 cm. This is compatible with worsening hepatic abscess. There is probable extrahepatic extension anteriorly with abnormal soft tissue in fluid extending anteriorly to the anterior abdominal wall. The overlying rectus muscle is thickened and possibly involved.  Other small low-density lesions within the liver are stable and compatible with cysts. Spleen, pancreas, adrenals and kidneys are normal. Visualized large and small bowel unremarkable. Stomach grossly unremarkable.  Airspace opacity is noted within the right lung  base, most pronounced in the right middle lobe. This may reflect atelectasis although pneumonia cannot be completely excluded. There are enlarged just subdiaphragmatic lymph nodes anteriorly. These are similar to prior study. Index node anterior to the liver margin has a short axis diameter of 7 mm compared with 10 mm previously.  Aorta is normal caliber. Shotty retroperitoneal lymph nodes which are not pathologically enlarged.  No acute bony abnormality.   IMPRESSION: Significant enlargement of the complex fluid collection in the anterior liver, likely not extending from the left hepatic lobe into the right hepatic lobe. This also appears to extend extrahepatic to the anterior abdominal wall with thickening of the right rectus muscle. Findings compatible with worsening hepatic abscess.  Airspace disease in the adjacent right lung base/right middle lobe. This could reflect atelectasis or pneumonia.  Continued enlarged juxta diaphragmatic lymph nodes.  Critical Value/emergent results were called by telephone at the time of interpretation on 08/09/2013 at 1:20PM to Dr. Sharlot Gowda , who verbally acknowledged these results.   Electronically Signed   By: Charlett Nose M.D.   On: 08/09/2013 13:37   Ct Image Guided Drainage By Percutaneous Catheter  08/10/2013   CLINICAL DATA:  Recurrent, large left lobe hepatic abscess requiring catheter drainage.  EXAM: CT GUIDED CATHETER DRAINAGE OF HEPATIC ABSCESS  ANESTHESIA/SEDATION: 2.5 Mg IV Versed 75 mcg IV Fentanyl  Total Moderate Sedation Time:  18 minutes  COMPARISON:  US ABDOMEN COMPLETE dated 08/09/2013; CT ABD WO/W CM dated 08/09/2013; CT ASPIRATION dated 06/17/2013; CT ABD WO/W CM dated 06/14/2013  PROCEDURE: The procedure, risks, benefits, and alternatives were explained to the patient. Questions regarding the procedure were encouraged and answered. The patient understands and consents to the procedure.  The anterior abdominal wall was prepped with Betadine in a sterile fashion, and a sterile drape was applied covering the operative field. A sterile gown and sterile gloves were used for the procedure. Local anesthesia was provided with 1% Lidocaine.  Under CT fluoroscopic guidance, an 18 gauge needle was advanced into the liver. Aspiration was performed. A fluid sample was sent for culture analysis. A guidewire was advanced. The tract was dilated. A 12 French percutaneous drain was placed. The drain was flushed with saline  and connected to a suction bulb. The catheter exit site was secured with a Prolene retention suture and adhesive StatLock device.  COMPLICATIONS: None  FINDINGS: Grossly purulent fluid was aspirated from the large hepatic abscess centered primarily in the left lobe and extending into the right lobe of the liver anteriorly. A fluid sample was sent for culture analysis. A12 [place] drain was positioned in the abscess and is draining well with thick purulent fluid return.  IMPRESSION: CT-guided percutaneous drainage of large hepatic abscess. A 12 French drain was placed. A fluid sample was sent for culture analysis.   Electronically Signed   By: Irish Lack M.D.   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 WBC trending to normal range, afebrile, on IV antibiotics. Patient to contact IR clinic [phone] when output diminishes over 24 hours approximately 15-30cc, I have spoke to the patient regarding this.  Follow-up CT abdomen with IV contrast no oral contrast needed for possible removal.  I have spoke with Dr. Fredia Sorrow today regarding this plan.   LOS: 2 days    Cloretta Ned 08/11/2013  11:00 AM

## 2013-08-12 ENCOUNTER — Telehealth: Payer: Self-pay | Admitting: Family Medicine

## 2013-08-12 LAB — GLUCOSE, CAPILLARY: GLUCOSE-CAPILLARY: 96 mg/dL (ref 70–99)

## 2013-08-12 MED ORDER — METRONIDAZOLE IN NACL 5-0.79 MG/ML-% IV SOLN: 500.0000 mg | Freq: Three times a day (TID) | INTRAVENOUS | Status: DC

## 2013-08-12 MED ORDER — ENSURE COMPLETE PO LIQD: 237.0000 mL | ORAL | Status: DC

## 2013-08-12 MED ORDER — SODIUM CHLORIDE 0.9 % IJ SOLN: 10.0000 mL | INTRAMUSCULAR | Status: DC | PRN

## 2013-08-12 MED ORDER — CIPROFLOXACIN IN D5W 400 MG/200ML IV SOLN: INTRAVENOUS | Status: DC

## 2013-08-12 MED ORDER — HYDROCODONE-ACETAMINOPHEN 5-325 MG PO TABS: 2.0000 | ORAL_TABLET | Freq: Four times a day (QID) | ORAL | Status: DC | PRN

## 2013-08-12 NOTE — Discharge Summary (Signed)
Aaron Leonard, is a 58 y.o. male  DOB [birth year]  MRN [id number].  Admission date:  08/09/2013  Admitting Physician  Dorothea Ogle, MD  Discharge Date:  08/12/2013   Primary MD  Carollee Herter, MD  Recommendations for primary care physician for things to follow:   Follow JP drain output, follow CBC CMP. Make sure patient follows with recommended outpatient physicians.   Admission Diagnosis  Hyponatremia [276.1] Leukocytosis [288.60] Liver abscess [572.0] Anemia [285.9]   Discharge Diagnosis  Hyponatremia [276.1] Leukocytosis [288.60] Liver abscess [572.0] Anemia [285.9]     Principal Problem:   Liver abscess Active Problems:   Leukocytosis   Anemia   Hyponatremia   Abnormal LFTs      Past Medical History  Diagnosis Date  . Allergy     RHINITIS  . ED (erectile dysfunction)   . Congenital anomalies of the testes   . Medical history non-contributory     History reviewed. No pertinent past surgical history.   Discharge Condition: Stable   Follow UP  Follow-up Information   Follow up with Carollee Herter, MD. Schedule an appointment as soon as possible for a visit in 3 days.   Specialty:  Family Medicine   Contact information:   [address] [place] [address] [phone]       Follow up with Cliffton Asters, MD. Schedule an appointment as soon as possible for a visit in 1 week.   Specialty:  Infectious Diseases   Contact information:   [address] [place] [address] [phone]       Follow up with Samuel Germany L, MD. Schedule an appointment as soon as possible for a visit in 1 week.   Specialty:  Gastroenterology   Contact information:   [address]                          [place] [address] [phone]       Follow up with  [facility] T, MD. Schedule an appointment as soon as possible for a visit in 1 week.   Specialty:  Interventional Radiology   Contact information:   [address] [place] [address] [phone]         Discharge Instructions  and  Discharge Medications      Discharge Orders   Future Orders Complete By Expires   Diet - low sodium heart healthy  As directed    Discharge instructions  As directed    Comments:     Follow with Primary MD Carollee Herter, MD in 7 days   Flush the drain with 10cc NS twice a day, empty three times a day, keep the bulb empty.  Do not use alcohol wipes around the drain.   Get CBC, CMP, checked 7 days by Primary MD and again as instructed by your Primary MD.     Activity: As tolerated with Full fall precautions use walker/cane & assistance as  needed   Disposition Home   Diet: Heart Healthy   For Heart failure patients - Check your Weight same time everyday, if you gain over 2 pounds, or you develop in leg swelling, experience more shortness of breath or chest pain, call your Primary MD immediately. Follow Cardiac Low Salt Diet and 1.8 lit/day fluid restriction.   On your next visit with her primary care physician please Get Medicines reviewed and adjusted.  Please request your Prim.MD to go over all Hospital Tests and Procedure/Radiological results at the follow up, please get all Hospital records sent to your Prim MD by signing hospital release before you go home.   If you experience worsening of your admission symptoms, develop shortness of breath, life threatening emergency, suicidal or homicidal thoughts you must seek medical attention immediately by calling 911 or calling your MD immediately  if symptoms less severe.  You Must read complete instructions/literature along with all the possible adverse reactions/side effects for all the Medicines you take and that have been prescribed to you. Take any new Medicines  after you have completely understood and accpet all the possible adverse reactions/side effects.   Do not drive and provide baby sitting services if your were admitted for syncope or siezures until you have seen by Primary MD or a Neurologist and advised to do so again.  Do not drive when taking Pain medications.    Do not take more than prescribed Pain, Sleep and Anxiety Medications  Special Instructions: If you have smoked or chewed Tobacco  in the last 2 yrs please stop smoking, stop any regular Alcohol  and or any Recreational drug use.  Wear Seat belts while driving.   Please note  You were cared for by a hospitalist during your hospital stay. If you have any questions about your discharge medications or the care you received while you were in the hospital after you are discharged, you can call the unit and asked to speak with the hospitalist on call if the hospitalist that took care of you is not available. Once you are discharged, your primary care physician will handle any further medical issues. Please note that NO REFILLS for any discharge medications will be authorized once you are discharged, as it is imperative that you return to your primary care physician (or establish a relationship with a primary care physician if you do not have one) for your aftercare needs so that they can reassess your need for medications and monitor your lab values.   Increase activity slowly  As directed        Medication List         ALEVE 220 MG tablet  Generic drug:  naproxen sodium  Take 440 mg by mouth 2 (two) times daily as needed (for pain).     ciprofloxacin 400 MG/200ML Soln  Commonly known as:  CIPRO  500mg  IV twice a day 2 week supply 1 refill     feeding supplement (ENSURE COMPLETE) Liqd  Take 237 mLs by mouth daily.     HYDROcodone-acetaminophen 5-325 MG per tablet  Commonly known as:  NORCO/VICODIN  Take 2 tablets by mouth every 6 (six) hours as needed for moderate pain.       metroNIDAZOLE 5-0.79 MG/ML-% IVPB  Commonly known as:  FLAGYL  Inject 100 mLs (500 mg total) into the vein every 8 (eight) hours. 500 mg IV every 8 hours, 2 week supply 1 refill     multivitamins with iron Tabs tablet  Take  1 tablet by mouth daily.          Diet and Activity recommendation: See Discharge Instructions above   Consults obtained - interventional radiology, GI, ID Dr. Orvan Falconer over the phone, general surgeon Dr. Blair Dolphin over the phone   Major procedures and Radiology Reports - PLEASE review detailed and final reports for all details, in brief -        US Abdomen Complete  08/09/2013   CLINICAL DATA:  Abnormal liver function tests.  Liver abscess.  EXAM: ULTRASOUND ABDOMEN COMPLETE  COMPARISON:  CT abdomen 08/09/2013  FINDINGS: Gallbladder:  No gallstones or wall thickening visualized. No sonographic Murphy sign noted.  Common bile duct:  Diameter: 3 mm  Liver:  As seen on recent CT, there is a large, complex collection involving both right and left hepatic lobes measuring approximately 13.9 x 8.3 x 10.6 cm with some mild peripheral vascularity. Liver was grossly normal in echogenicity elsewhere. No gross intrahepatic biliary dilatation identified.  IVC:  No abnormality visualized.  Pancreas:  Not well visualized.  Spleen:  Size and appearance within normal limits.  Right Kidney:  Length: 13.0 cm. Echogenicity within normal limits. No mass or hydronephrosis visualized.  Left Kidney:  Length: 12.7 cm. Echogenicity within normal limits. No mass or hydronephrosis visualized.  Abdominal aorta:  No aneurysm visualized.  Other findings:  None.  IMPRESSION: Large, complex collection in the liver, consistent with previously described abscess.   Electronically Signed   By: Sebastian Ache   On: 08/09/2013 23:06   Ct Abd Wo & W Cm  08/09/2013   CLINICAL DATA:  History of liver abscess.  Follow-up.  EXAM: CT ABDOMEN WITHOUT AND WITH CONTRAST  TECHNIQUE: Multidetector CT imaging of the  abdomen was performed following the standard protocol before and following the bolus administration of intravenous contrast.  CONTRAST:  OMNIPAQUE IOHEXOL 300 MG/ML  SOLN  COMPARISON:  06/14/2013  FINDINGS: Large complex fluid collection noted within the medial segment of the left hepatic lobe and extending into the right hepatic lobe. This has increased in size since prior study. This currently measures approximately 14.3 x 7.5 cm on image 22 of series 8. This previously measured 7.4 x 5.9 cm. This is compatible with worsening hepatic abscess. There is probable extrahepatic extension anteriorly with abnormal soft tissue in fluid extending anteriorly to the anterior abdominal wall. The overlying rectus muscle is thickened and possibly involved.  Other small low-density lesions within the liver are stable and compatible with cysts. Spleen, pancreas, adrenals and kidneys are normal. Visualized large and small bowel unremarkable. Stomach grossly unremarkable.  Airspace opacity is noted within the right lung base, most pronounced in the right middle lobe. This may reflect atelectasis although pneumonia cannot be completely excluded. There are enlarged just subdiaphragmatic lymph nodes anteriorly. These are similar to prior study. Index node anterior to the liver margin has a short axis diameter of 7 mm compared with 10 mm previously.  Aorta is normal caliber. Shotty retroperitoneal lymph nodes which are not pathologically enlarged.  No acute bony abnormality.  IMPRESSION: Significant enlargement of the complex fluid collection in the anterior liver, likely not extending from the left hepatic lobe into the right hepatic lobe. This also appears to extend extrahepatic to the anterior abdominal wall with thickening of the right rectus muscle. Findings compatible with worsening hepatic abscess.  Airspace disease in the adjacent right lung base/right middle lobe. This could reflect atelectasis or pneumonia.  Continued  enlarged juxta diaphragmatic lymph nodes.  Critical Value/emergent results were called by telephone at the time of interpretation on 08/09/2013 at 1:20PM to Dr. Sharlot Gowda , who verbally acknowledged these results.   Electronically Signed   By: Charlett Nose M.D.   On: 08/09/2013 13:37   Ct Image Guided Drainage By Percutaneous Catheter  08/10/2013   CLINICAL DATA:  Recurrent, large left lobe hepatic abscess requiring catheter drainage.  EXAM: CT GUIDED CATHETER DRAINAGE OF HEPATIC ABSCESS  ANESTHESIA/SEDATION: 2.5 Mg IV Versed 75 mcg IV Fentanyl  Total Moderate Sedation Time:  18 minutes  COMPARISON:  US ABDOMEN COMPLETE dated 08/09/2013; CT ABD WO/W CM dated 08/09/2013; CT ASPIRATION dated 06/17/2013; CT ABD WO/W CM dated 06/14/2013  PROCEDURE: The procedure, risks, benefits, and alternatives were explained to the patient. Questions regarding the procedure were encouraged and answered. The patient understands and consents to the procedure.  The anterior abdominal wall was prepped with Betadine in a sterile fashion, and a sterile drape was applied covering the operative field. A sterile gown and sterile gloves were used for the procedure. Local anesthesia was provided with 1% Lidocaine.  Under CT fluoroscopic guidance, an 18 gauge needle was advanced into the liver. Aspiration was performed. A fluid sample was sent for culture analysis. A guidewire was advanced. The tract was dilated. A 12 French percutaneous drain was placed. The drain was flushed with saline and connected to a suction bulb. The catheter exit site was secured with a Prolene retention suture and adhesive StatLock device.  COMPLICATIONS: None  FINDINGS: Grossly purulent fluid was aspirated from the large hepatic abscess centered primarily in the left lobe and extending into the right lobe of the liver anteriorly. A fluid sample was sent for culture analysis. A12 [place] drain was positioned in the abscess and is draining well with thick purulent  fluid return.  IMPRESSION: CT-guided percutaneous drainage of large hepatic abscess. A 12 French drain was placed. A fluid sample was sent for culture analysis.   Electronically Signed   By: Irish Lack M.D.   On: 08/10/2013 15:14    Micro Results      Recent Results (from the past 240 hour(s))  SURGICAL PCR SCREEN     Status: Abnormal   Collection Time    08/09/13 11:27 PM      Result Value Ref Range Status   MRSA, PCR NEGATIVE  NEGATIVE Final   Staphylococcus aureus POSITIVE (*) NEGATIVE Final   Comment:            The Xpert SA Assay (FDA     approved for NASAL specimens     in patients over 16 years of age),     is one component of     a comprehensive surveillance     program.  Test performance has     been validated by [employer] for patients greater     than or equal to 58 year old.     It is not intended     to diagnose infection nor to     guide or monitor treatment.  CULTURE, BLOOD (ROUTINE X 2)     Status: None   Collection Time    08/10/13 12:50 PM      Result Value Ref Range Status   Specimen Description BLOOD RIGHT HAND   Final   Special Requests BOTTLES DRAWN AEROBIC AND ANAEROBIC 10CC   Final   Culture  Setup Time     Final   Value: 08/10/2013 18:57  Performed at [facility]   Culture     Final   Value:        BLOOD CULTURE RECEIVED NO GROWTH TO DATE CULTURE WILL BE HELD FOR 5 DAYS BEFORE ISSUING A FINAL NEGATIVE REPORT     Performed at [facility]   Report Status PENDING   Incomplete  CULTURE, BLOOD (ROUTINE X 2)     Status: None   Collection Time    08/10/13 12:55 PM      Result Value Ref Range Status   Specimen Description BLOOD RIGHT ARM   Final   Special Requests BOTTLES DRAWN AEROBIC AND ANAEROBIC 10CC   Final   Culture  Setup Time     Final   Value: 08/10/2013 18:57     Performed at [facility]   Culture     Final   Value:        BLOOD CULTURE RECEIVED NO GROWTH TO DATE CULTURE WILL BE HELD FOR 5 DAYS BEFORE  ISSUING A FINAL NEGATIVE REPORT     Performed at [facility]   Report Status PENDING   Incomplete  CULTURE, ROUTINE-ABSCESS     Status: None   Collection Time    08/10/13  2:33 PM      Result Value Ref Range Status   Specimen Description ABSCESS   Final   Special Requests ASPIRATE FROM HEPATIC ABSCESS   Final   Gram Stain     Final   Value: ABUNDANT WBC PRESENT, PREDOMINANTLY PMN     RARE SQUAMOUS EPITHELIAL CELLS PRESENT     ABUNDANT GRAM POSITIVE COCCI IN PAIRS     FEW GRAM NEGATIVE RODS     Performed at [facility]   Culture     Final   Value: NO GROWTH 1 DAY     Performed at [facility]   Report Status PENDING   Incomplete  ANAEROBIC CULTURE     Status: None   Collection Time    08/10/13  2:52 PM      Result Value Ref Range Status   Specimen Description ABSCESS ASPIRATE FROM HEPATIC ABSCESS   Final   Special Requests Normal   Final   Gram Stain     Final   Value: ABUNDANT WBC PRESENT, PREDOMINANTLY PMN     NO SQUAMOUS EPITHELIAL CELLS SEEN     ABUNDANT GRAM POSITIVE COCCI IN PAIRS     FEW GRAM NEGATIVE RODS     Performed at [facility]   Culture     Final   Value: NO ANAEROBES ISOLATED; CULTURE IN PROGRESS FOR 5 DAYS     Performed at [facility]   Report Status PENDING   Incomplete     History of present illness and  Hospital Course:     Kindly see H&P for history of present illness and admission details, please review complete Labs, Consult reports and Test reports for all details in brief Aaron Leonard, is a 58 y.o. male, patient with history of  with no significant past medical history except recent diagnosis of liver abscess in the left lobe of liver few months ago which was drained apparently and patient received outpatient antibiotics, came in with chief complaints of right upper quadrant pain along with chills, CT scan of his abdomen and pelvis showed reoccurrence of hepatic abscess close to the previous site.   He  was seen by IR, he underwent liver abscess drain placement and was  placed on IV Cipro and Flagyl, subsequently his white count and pain have completely resolved. Liver enzymes are coming down, he was seen by GI here and is scheduled to follow up with him in the outpatient setting along with IR physicians. He will be placed on 2 weeks of IV antibiotics, subsequently repeat CT scan per IR based on his drain output, he will also follow with ID outpatient to decide on total duration of antibiotic treatment. Case was discussed over the phone with ID physician Dr. Orvan Falconer. His cultures from the abscess are still pending. It is clinically responded to Cipro and Flagyl.     Note general surgery was consulted but we were told that nothing to offer, I discussed this personally with Dr. Derrell Lolling.    Patient will receive a PICC line, home health nursing will be arranged for IV antibiotics and drain monitoring, drain care instructions have been provided to the patient in writing.    He also has mild anemia which needs outpatient workup by PCP.       Today   Subjective:   Torri Langston today has no headache,no chest abdominal pain,no new weakness tingling or numbness, feels much better wants to go home today.   Objective:   Blood pressure 128/83, pulse 63, temperature 97.4 F (36.3 C), temperature source Oral, resp. rate 17, height 5\' 9"  (1.753 m), weight 67.949 kg (149 lb 12.8 oz), SpO2 95.00%.   Intake/Output Summary (Last 24 hours) at 08/12/13 0801 Last data filed at 08/12/13 0520  Gross per 24 hour  Intake    515 ml  Output    510 ml  Net      5 ml    Exam Awake Alert, Oriented *3, No new F.N deficits, Normal affect [place].AT,PERRAL Supple Neck,No JVD, No cervical lymphadenopathy appriciated.  Symmetrical Chest wall movement, Good air movement bilaterally, CTAB, liver abscess drain in place RRR,No Gallops,Rubs or new Murmurs, No Parasternal Heave +ve B.Sounds, Abd Soft, Non tender, No  organomegaly appriciated, No rebound -guarding or rigidity. No Cyanosis, Clubbing or edema, No new Rash or bruise  Data Review   CBC w Diff: Lab Results  Component Value Date   WBC 10.4 08/11/2013   HGB 9.7* 08/11/2013   HCT 29.8* 08/11/2013   PLT 416* 08/11/2013   LYMPHOPCT 10* 08/09/2013   MONOPCT 11 08/09/2013   EOSPCT 0 08/09/2013   BASOPCT 0 08/09/2013    CMP: Lab Results  Component Value Date   NA 138 08/11/2013   K 4.6 08/11/2013   CL 102 08/11/2013   CO2 26 08/11/2013   BUN 9 08/11/2013   CREATININE 0.62 08/11/2013   CREATININE 0.63 08/05/2013   PROT 7.2 08/11/2013   ALBUMIN 2.2* 08/11/2013   BILITOT 0.4 08/11/2013   ALKPHOS 122* 08/11/2013   AST 40* 08/11/2013   ALT 30 08/11/2013  .   Total Time in preparing paper work, data evaluation and todays exam - 35 minutes  Leroy Sea M.D on 08/12/2013 at 8:01 AM  [facility] Office  [phone]

## 2013-08-12 NOTE — Progress Notes (Signed)
08/12/13 Spoke with patient about home IV antibiotics. He is agreeable to learning togive IV antibiotics and states that his wife will be able to as well.He chose [facility]. Contacted Marie at [facility] and set up IV antibiotics and HHRN for JPp drain monitoring and Iv antibiotic teaching/monitoring. Advanced plan to see patient at home this pm. Jacquelynn CreeMary Ida Milbrath RN, BSN, CCM

## 2013-08-12 NOTE — Progress Notes (Signed)
Patient was discharged home with wife and home health for IV ABx by MD order; discharged instructions  review and give to patient with care notes and prescriptions; IV DIC; skin intact; PICC line inserted on right upper arm, patent, dressing intact, clean, dry and remain in place at discharged. Patient and wife returned demonstration of empting  the JP drain. Patient will be escorted to the car by volunteer via wheelchair.

## 2013-08-12 NOTE — Progress Notes (Signed)
Peripherally Inserted Central Catheter/Midline Placement  The IV Nurse has discussed with the patient and/or persons authorized to consent for the patient, the purpose of this procedure and the potential benefits and risks involved with this procedure.  The benefits include less needle sticks, lab draws from the catheter and patient may be discharged home with the catheter.  Risks include, but not limited to, infection, bleeding, blood clot (thrombus formation), and puncture of an artery; nerve damage and irregular heat beat.  Alternatives to this procedure were also discussed.  PICC/Midline Placement Documentation        Stacie GlazeJoyce, Orlene Salmons Horton 08/12/2013, 10:21 AM

## 2013-08-12 NOTE — Discharge Instructions (Signed)
Follow with Primary MD [name],[name], MD in 7 days   Flush the drain with 10cc NS twice a day, empty three times a day, keep the bulb empty.  Do not use alcohol wipes around the drain.   Get CBC, CMP, checked 7 days by Primary MD and again as instructed by your Primary MD.     Activity: As tolerated with Full fall precautions use walker/cane & assistance as needed   Disposition Home   Diet: Heart Healthy   For Heart failure patients - Check your Weight same time everyday, if you gain over 2 pounds, or you develop in leg swelling, experience more shortness of breath or chest pain, call your Primary MD immediately. Follow Cardiac Low Salt Diet and 1.8 lit/day fluid restriction.   On your next visit with her primary care physician please Get Medicines reviewed and adjusted.  Please request your Prim.MD to go over all Hospital Tests and Procedure/Radiological results at the follow up, please get all Hospital records sent to your Prim MD by signing hospital release before you go home.   If you experience worsening of your admission symptoms, develop shortness of breath, life threatening emergency, suicidal or homicidal thoughts you must seek medical attention immediately by calling 911 or calling your MD immediately  if symptoms less severe.  You Must read complete instructions/literature along with all the possible adverse reactions/side effects for all the Medicines you take and that have been prescribed to you. Take any new Medicines after you have completely understood and accpet all the possible adverse reactions/side effects.   Do not drive and provide baby sitting services if your were admitted for syncope or siezures until you have seen by Primary MD or a Neurologist and advised to do so again.  Do not drive when taking Pain medications.    Do not take more than prescribed Pain, Sleep and Anxiety Medications  Special Instructions: If you have smoked or chewed Tobacco   in the last 2 yrs please stop smoking, stop any regular Alcohol  and or any Recreational drug use.  Wear Seat belts while driving.   Please note  You were cared for by a hospitalist during your hospital stay. If you have any questions about your discharge medications or the care you received while you were in the hospital after you are discharged, you can call the unit and asked to speak with the hospitalist on call if the hospitalist that took care of you is not available. Once you are discharged, your primary care physician will handle any further medical issues. Please note that NO REFILLS for any discharge medications will be authorized once you are discharged, as it is imperative that you return to your primary care physician (or establish a relationship with a primary care physician if you do not have one) for your aftercare needs so that they can reassess your need for medications and monitor your lab values.

## 2013-08-13 LAB — CULTURE, ROUTINE-ABSCESS

## 2013-08-14 NOTE — Telephone Encounter (Signed)
APPT MADE

## 2013-08-15 ENCOUNTER — Inpatient Hospital Stay: Payer: BC Managed Care – PPO | Admitting: Family Medicine

## 2013-08-15 ENCOUNTER — Ambulatory Visit (INDEPENDENT_AMBULATORY_CARE_PROVIDER_SITE_OTHER): Payer: BC Managed Care – PPO | Admitting: Family Medicine

## 2013-08-15 ENCOUNTER — Encounter: Payer: Self-pay | Admitting: Family Medicine

## 2013-08-15 VITALS — BP 138/88 | HR 64 | Wt 164.0 lb

## 2013-08-15 DIAGNOSIS — K750 Abscess of liver: Secondary | ICD-10-CM

## 2013-08-15 DIAGNOSIS — Z79899 Other long term (current) drug therapy: Secondary | ICD-10-CM

## 2013-08-15 LAB — COMPREHENSIVE METABOLIC PANEL
ALBUMIN: 3.1 g/dL — AB (ref 3.5–5.2)
ALK PHOS: 119 U/L — AB (ref 39–117)
ALT: 58 U/L — ABNORMAL HIGH (ref 0–53)
AST: 121 U/L — AB (ref 0–37)
BUN: 13 mg/dL (ref 6–23)
CALCIUM: 9.1 mg/dL (ref 8.4–10.5)
CHLORIDE: 104 meq/L (ref 96–112)
CO2: 27 mEq/L (ref 19–32)
Creat: 0.78 mg/dL (ref 0.50–1.35)
Glucose, Bld: 92 mg/dL (ref 70–99)
POTASSIUM: 4.4 meq/L (ref 3.5–5.3)
SODIUM: 137 meq/L (ref 135–145)
TOTAL PROTEIN: 7.7 g/dL (ref 6.0–8.3)
Total Bilirubin: 0.3 mg/dL (ref 0.2–1.2)

## 2013-08-15 LAB — ANAEROBIC CULTURE: Special Requests: NORMAL

## 2013-08-15 LAB — CBC WITH DIFFERENTIAL/PLATELET
BASOS PCT: 1 % (ref 0–1)
Basophils Absolute: 0.1 10*3/uL (ref 0.0–0.1)
Eosinophils Absolute: 0.1 10*3/uL (ref 0.0–0.7)
Eosinophils Relative: 1 % (ref 0–5)
HEMATOCRIT: 33.7 % — AB (ref 39.0–52.0)
Hemoglobin: 11 g/dL — ABNORMAL LOW (ref 13.0–17.0)
LYMPHS PCT: 22 % (ref 12–46)
Lymphs Abs: 2.4 10*3/uL (ref 0.7–4.0)
MCH: 26.9 pg (ref 26.0–34.0)
MCHC: 32.6 g/dL (ref 30.0–36.0)
MCV: 82.4 fL (ref 78.0–100.0)
MONO ABS: 0.8 10*3/uL (ref 0.1–1.0)
Monocytes Relative: 7 % (ref 3–12)
NEUTROS ABS: 7.5 10*3/uL (ref 1.7–7.7)
NEUTROS PCT: 69 % (ref 43–77)
Platelets: 575 10*3/uL — ABNORMAL HIGH (ref 150–400)
RBC: 4.09 MIL/uL — ABNORMAL LOW (ref 4.22–5.81)
RDW: 15.7 % — ABNORMAL HIGH (ref 11.5–15.5)
WBC: 10.9 10*3/uL — AB (ref 4.0–10.5)

## 2013-08-15 NOTE — Progress Notes (Signed)
   Subjective:    Patient ID: Aaron Leonard, male    DOB: 10/05/1955, 58 y.o.   MRN: [id number]  HPI He is here for followup after recent hospitalization and treatment for a liver abscess. He is getting IV antibiotics. He is having no difficulty with that. He states that the drainage has slowed down. They empty the bag 2 or 3 times per day which is less often than several days ago. He has had no fever, chills, nausea, vomiting, abdominal pain or diarrhea. He does have appointments that need to be scheduled in he plans to do that this afternoon.   Review of Systems     Objective:   Physical Exam Alert and in no distress. PICC line in place. Drainage tube is noted to have serosanguineous fluid. Abdominal exam shows no masses or tenderness. The hospital record including discharge summary was reviewed.       Assessment & Plan:  Liver abscess - Plan: CBC with Differential, Comprehensive metabolic panel  Encounter for long-term (current) use of other medications - Plan: CBC with Differential, Comprehensive metabolic panel  He will followup with ID, GI and with IR. Explained that followup is appropriate on this and I will help coordinate all these referrals as needed. Over 25 minutes spent discussing his overall care with him and his wife.

## 2013-08-16 LAB — CULTURE, BLOOD (ROUTINE X 2)
Culture: NO GROWTH
Culture: NO GROWTH

## 2013-08-19 ENCOUNTER — Other Ambulatory Visit (HOSPITAL_COMMUNITY): Payer: Self-pay | Admitting: Interventional Radiology

## 2013-08-19 ENCOUNTER — Telehealth: Payer: Self-pay

## 2013-08-19 ENCOUNTER — Telehealth: Payer: Self-pay | Admitting: Family Medicine

## 2013-08-19 DIAGNOSIS — K750 Abscess of liver: Secondary | ICD-10-CM

## 2013-08-19 NOTE — Telephone Encounter (Signed)
CT ordered for followup on it drainage of liver abscess

## 2013-08-19 NOTE — Progress Notes (Signed)
Is this patient due for just a lab visit to follow up on this blood work or does he need an OV as well?

## 2013-08-19 NOTE — Telephone Encounter (Signed)
Form on your desk  

## 2013-08-19 NOTE — Telephone Encounter (Signed)
Got pt CT w/contrast approved..#[id number].#[id number] through dated 08/19/13-09/17/13.

## 2013-08-20 ENCOUNTER — Encounter: Payer: Self-pay | Admitting: Family Medicine

## 2013-08-20 NOTE — Progress Notes (Signed)
   Subjective:    Patient ID: Aaron Leonard, male    DOB: 12/29/1955, 58 y.o.   MRN: [id number]  HPI    Review of Systems     Objective:   Physical Exam        Assessment & Plan:  Scheduled to be seen March 3 and 4 to further evaluate his liver abscess and antibiotics. I will fill out his FMLA to get him to March 4. I explained that any missed work after that time will then need to be handled separately. He is to have one of the specialists fill out the paperwork or give me information concerning this. He expressed understanding of this.

## 2013-08-21 ENCOUNTER — Encounter: Payer: Self-pay | Admitting: Family Medicine

## 2013-08-28 ENCOUNTER — Ambulatory Visit (INDEPENDENT_AMBULATORY_CARE_PROVIDER_SITE_OTHER): Payer: BC Managed Care – PPO | Admitting: Internal Medicine

## 2013-08-28 ENCOUNTER — Encounter: Payer: Self-pay | Admitting: Internal Medicine

## 2013-08-28 ENCOUNTER — Ambulatory Visit
Admission: RE | Admit: 2013-08-28 | Discharge: 2013-08-28 | Disposition: A | Discharge status: Home / Self Care | Payer: BC Managed Care – PPO | Source: Ambulatory Visit | Attending: Interventional Radiology | Admitting: Interventional Radiology

## 2013-08-28 ENCOUNTER — Telehealth: Payer: Self-pay | Admitting: Family Medicine

## 2013-08-28 ENCOUNTER — Ambulatory Visit
Admission: RE | Admit: 2013-08-28 | Discharge: 2013-08-28 | Disposition: A | Discharge status: Home / Self Care | Payer: BC Managed Care – PPO | Source: Ambulatory Visit | Attending: Family Medicine | Admitting: Family Medicine

## 2013-08-28 VITALS — BP 133/87 | HR 85 | Temp 97.1°F | Wt 161.0 lb

## 2013-08-28 DIAGNOSIS — K750 Abscess of liver: Secondary | ICD-10-CM

## 2013-08-28 LAB — CBC WITH DIFFERENTIAL/PLATELET
BASOS ABS: 0.1 10*3/uL (ref 0.0–0.1)
Basophils Relative: 1 % (ref 0–1)
EOS ABS: 0.3 10*3/uL (ref 0.0–0.7)
Eosinophils Relative: 4 % (ref 0–5)
HCT: 36.6 % — ABNORMAL LOW (ref 39.0–52.0)
Hemoglobin: 12.5 g/dL — ABNORMAL LOW (ref 13.0–17.0)
Lymphocytes Relative: 28 % (ref 12–46)
Lymphs Abs: 1.9 10*3/uL (ref 0.7–4.0)
MCH: 28.7 pg (ref 26.0–34.0)
MCHC: 34.2 g/dL (ref 30.0–36.0)
MCV: 84.1 fL (ref 78.0–100.0)
MONO ABS: 0.7 10*3/uL (ref 0.1–1.0)
Monocytes Relative: 11 % (ref 3–12)
Neutro Abs: 3.8 10*3/uL (ref 1.7–7.7)
Neutrophils Relative %: 56 % (ref 43–77)
PLATELETS: 469 10*3/uL — AB (ref 150–400)
RBC: 4.35 MIL/uL (ref 4.22–5.81)
RDW: 18.5 % — AB (ref 11.5–15.5)
WBC: 6.7 10*3/uL (ref 4.0–10.5)

## 2013-08-28 LAB — COMPLETE METABOLIC PANEL WITH GFR
ALT: 59 U/L — ABNORMAL HIGH (ref 0–53)
AST: 75 U/L — AB (ref 0–37)
Albumin: 3.8 g/dL (ref 3.5–5.2)
Alkaline Phosphatase: 96 U/L (ref 39–117)
BILIRUBIN TOTAL: 0.3 mg/dL (ref 0.2–1.2)
BUN: 19 mg/dL (ref 6–23)
CHLORIDE: 102 meq/L (ref 96–112)
CO2: 26 mEq/L (ref 19–32)
Calcium: 9.5 mg/dL (ref 8.4–10.5)
Creat: 0.63 mg/dL (ref 0.50–1.35)
GFR, Est African American: >89 mL/min
GFR, Est Non African American: >89 mL/min
Glucose, Bld: 92 mg/dL (ref 70–99)
Potassium: 4.8 mEq/L (ref 3.5–5.3)
Sodium: 137 mEq/L (ref 135–145)
Total Protein: 8.2 g/dL (ref 6.0–8.3)

## 2013-08-28 LAB — C-REACTIVE PROTEIN: CRP: <0.5 mg/dL (ref <0.60)

## 2013-08-28 LAB — SEDIMENTATION RATE: Sed Rate: 12 mm/hr (ref 0–16)

## 2013-08-28 MED ORDER — METRONIDAZOLE 500 MG PO TABS: 500.0000 mg | ORAL_TABLET | Freq: Three times a day (TID) | ORAL | Status: DC

## 2013-08-28 MED ORDER — IOHEXOL 300 MG/ML  SOLN
100.0000 mL | Freq: Once | INTRAMUSCULAR | Status: AC | PRN
  Administered 2013-08-28: 100 mL via INTRAVENOUS

## 2013-08-28 NOTE — Progress Notes (Signed)
Subjective:    Patient ID: Aaron Leonard, male    DOB: July 07, 1955, 58 y.o.   MRN: [id number]  HPI 58yo M with diagnosis of peptostreptococcus liver abscess in Dec 2014 where he underwent IR aspiration of 80mL purulent fluid at that time. He was given amox/clav for nearly 2 wks and then underwent repeat imaging in mid February since he had ongoing abdominal pain and it revealed marked increase in size of hepatic abscess. He was admitted for intent to start IV antibiotics in addition to IR drain placement. His repeat cultures of hepatic culture showed peptostreptococcus again. He was discharged on nearly 3 wks of cipro as well as metronidazole which he has completed yesterday. He is scheduled to get a repeat abdominal scan to see if his abscess have resolved. He  States that his abdominal bulb no longer drains any significant fluid for the past 5 days. He is looking forward to having abdominal drain removed. No difficulty with picc line.  Current Outpatient Prescriptions on File Prior to Visit  Medication Sig Dispense Refill  . Multiple Vitamins-Iron (MULTIVITAMINS WITH IRON) TABS tablet Take 1 tablet by mouth daily.      . feeding supplement, ENSURE COMPLETE, (ENSURE COMPLETE) LIQD Take 237 mLs by mouth daily.  30 Bottle  1  . HYDROcodone-acetaminophen (NORCO/VICODIN) 5-325 MG per tablet Take 2 tablets by mouth every 6 (six) hours as needed for moderate pain.  20 tablet  0  . naproxen sodium (ALEVE) 220 MG tablet Take 440 mg by mouth 2 (two) times daily as needed (for pain).       No current facility-administered medications on file prior to visit.   Active Ambulatory Problems    Diagnosis Date Noted  . Liver abscess 08/09/2013  . Leukocytosis 08/09/2013  . Anemia 08/09/2013  . Hyponatremia 08/09/2013  . Abnormal LFTs 08/09/2013   Resolved Ambulatory Problems    Diagnosis Date Noted  . ED (erectile dysfunction) 06/06/2011  . Allergic rhinitis, seasonal 06/06/2011   Past Medical History    Diagnosis Date  . Allergy   . Congenital anomalies of the testes   . Medical history non-contributory    History  Substance Use Topics  . Smoking status: Never Smoker   . Smokeless tobacco: Never Used  . Alcohol Use: No  family history is not on file.   Review of Systems  Constitutional: Negative for fever, chills, diaphoresis, activity change, appetite change, fatigue and unexpected weight change.  HENT: Negative for congestion, sore throat, rhinorrhea, sneezing, trouble swallowing and sinus pressure.  Eyes: Negative for photophobia and visual disturbance.  Respiratory: Negative for cough, chest tightness, shortness of breath, wheezing and stridor.  Cardiovascular: Negative for chest pain, palpitations and leg swelling.  Gastrointestinal: Negative for nausea, vomiting, abdominal pain, diarrhea, constipation, blood in stool, abdominal distention and anal bleeding.  Genitourinary: Negative for dysuria, hematuria, flank pain and difficulty urinating.  Musculoskeletal: Negative for myalgias, back pain, joint swelling, arthralgias and gait problem.  Skin: Negative for color change, pallor, rash and wound.  Neurological: Negative for dizziness, tremors, weakness and light-headedness.  Hematological: Negative for adenopathy. Does not bruise/bleed easily.  Psychiatric/Behavioral: Negative for behavioral problems, confusion, sleep disturbance, dysphoric mood, decreased concentration and agitation.       Objective:   Physical Exam BP 133/87  Pulse 85  Temp(Src) 97.1 F (36.2 C) (Oral)  Wt 161 lb (73.029 kg) gen = a xo by 3 in NAD HEENT = PERRLA, EOMI, no scleral icterus, op clear abd =  single abdominal drain with serosanguinous drainage Ext = right arm picc line is c/d/i      Assessment & Plan:  Hepatic abscess with peptostreptococcus = Will give iv cetriaxone 2gm IV daily  plus oral metronidazole 500mg  TID x 3 wks but may consider to shorten the course pending the results from  his abd ct scan. We will also check Labs today.  rtc in 3 wks.

## 2013-08-29 ENCOUNTER — Encounter: Payer: Self-pay | Admitting: Family Medicine

## 2013-08-29 ENCOUNTER — Telehealth: Payer: Self-pay | Admitting: *Deleted

## 2013-08-29 NOTE — Telephone Encounter (Signed)
Per Dr. Drue SecondSnider, IV antibiotics will stop and PICC is to be pulled 08/31/13.   [facility] Andree CossHowell, Anaeli Cornwall M, RN

## 2013-08-30 ENCOUNTER — Other Ambulatory Visit: Payer: Self-pay | Admitting: Internal Medicine

## 2013-08-30 ENCOUNTER — Encounter: Payer: Self-pay | Admitting: Family Medicine

## 2013-08-30 DIAGNOSIS — K750 Abscess of liver: Secondary | ICD-10-CM

## 2013-08-30 MED ORDER — CIPROFLOXACIN HCL 500 MG PO TABS: 500.0000 mg | ORAL_TABLET | Freq: Two times a day (BID) | ORAL | Status: DC

## 2013-08-30 NOTE — Telephone Encounter (Signed)
lm

## 2013-09-06 ENCOUNTER — Telehealth: Payer: Self-pay | Admitting: Internal Medicine

## 2013-09-06 NOTE — Telephone Encounter (Signed)
Pt called stating that his FLMA for [facility] and [facility] had 2 different dates for him to return and he did not get paid for some of those days. [facility] had he can return 08/28/13 and his [facility] had he can return 09/01/13. He needs [facility] to state he could return on 09/01/13 and be faxed back. i will put FLMA papers in your folder

## 2013-09-09 NOTE — Telephone Encounter (Signed)
Dr. Susann GivensLalonde corrected info and we faxed back

## 2013-09-19 ENCOUNTER — Encounter: Payer: Self-pay | Admitting: Internal Medicine

## 2013-09-19 ENCOUNTER — Ambulatory Visit (INDEPENDENT_AMBULATORY_CARE_PROVIDER_SITE_OTHER): Payer: BC Managed Care – PPO | Admitting: Internal Medicine

## 2013-09-19 VITALS — BP 133/91 | HR 80 | Temp 97.8°F | Wt 166.5 lb

## 2013-09-19 DIAGNOSIS — K750 Abscess of liver: Secondary | ICD-10-CM

## 2013-09-19 NOTE — Progress Notes (Signed)
Subjective:    Patient ID: Aaron LentPaul I Leonard, male    DOB: 01/26/1956, 58 y.o.   MRN: [id number]  HPI 58yo M with diagnosis of peptostreptococcus liver abscess in Dec 2014 where he underwent IR aspiration of 80mL purulent fluid at that time. He was given amox/clav for nearly 2 wks but was lost to follow up until mid February. he underwent repeat imaging and it revealed marked increase in size of hepatic abscess. He was admitted for intent to start IV antibiotics in addition to IR drain placement. His repeat cultures of hepatic culture showed peptostreptococcus . He was discharged on  3 wks of cipro as well as metronidazole IV taken until 3/4 then we switched him to oral equivalent. His repeat abdominal scan showed near resolution of abscess and abdominal drains pulled.  He has been back to work without difficulty. he was switched to oral cipro and metronidazolae at our last visit for which he is still taking. No fever or chills or rash or diarrhea or yeast infection no thrush or abdominal pain  Current Outpatient Prescriptions on File Prior to Visit  Medication Sig Dispense Refill  . ciprofloxacin (CIPRO) 500 MG tablet Take 1 tablet (500 mg total) by mouth 2 (two) times daily.  60 tablet  0  . metroNIDAZOLE (FLAGYL) 500 MG tablet Take 1 tablet (500 mg total) by mouth 3 (three) times daily.  90 tablet  0  . Multiple Vitamins-Iron (MULTIVITAMINS WITH IRON) TABS tablet Take 1 tablet by mouth daily.      . naproxen sodium (ALEVE) 220 MG tablet Take 440 mg by mouth 2 (two) times daily as needed (for pain).      . feeding supplement, ENSURE COMPLETE, (ENSURE COMPLETE) LIQD Take 237 mLs by mouth daily.  30 Bottle  1  . HYDROcodone-acetaminophen (NORCO/VICODIN) 5-325 MG per tablet Take 2 tablets by mouth every 6 (six) hours as needed for moderate pain.  20 tablet  0   No current facility-administered medications on file prior to visit.   Active Ambulatory Problems    Diagnosis Date Noted  . Liver abscess  08/09/2013  . Leukocytosis 08/09/2013  . Anemia 08/09/2013  . Hyponatremia 08/09/2013  . Abnormal LFTs 08/09/2013   Resolved Ambulatory Problems    Diagnosis Date Noted  . ED (erectile dysfunction) 06/06/2011  . Allergic rhinitis, seasonal 06/06/2011   Past Medical History  Diagnosis Date  . Allergy   . Congenital anomalies of the testes   . Medical history non-contributory         Review of Systems Review of Systems  Constitutional: Negative for fever, chills, diaphoresis, activity change, appetite change, fatigue and unexpected weight change.  HENT: Negative for congestion, sore throat, rhinorrhea, sneezing, trouble swallowing and sinus pressure.  Eyes: Negative for photophobia and visual disturbance.  Respiratory: Negative for cough, chest tightness, shortness of breath, wheezing and stridor.  Cardiovascular: Negative for chest pain, palpitations and leg swelling.  Gastrointestinal: Negative for nausea, vomiting, abdominal pain, diarrhea, constipation, blood in stool, abdominal distention and anal bleeding.  Genitourinary: Negative for dysuria, hematuria, flank pain and difficulty urinating.  Musculoskeletal: Negative for myalgias, back pain, joint swelling, arthralgias and gait problem.  Skin: Negative for color change, pallor, rash and wound.  Neurological: Negative for dizziness, tremors, weakness and light-headedness.  Hematological: Negative for adenopathy. Does not bruise/bleed easily.  Psychiatric/Behavioral: Negative for behavioral problems, confusion, sleep disturbance, dysphoric mood, decreased concentration and agitation.       Objective:   Physical Exam BP  133/91  Pulse 80  Temp(Src) 97.8 F (36.6 C) (Oral)  Wt 166 lb 8 oz (75.524 kg)  Physical Exam  Constitutional: He is oriented to person, place, and time. He appears well-developed and well-nourished. No distress.  Abdominal: Soft. Bowel sounds are normal. He exhibits no distension. There is no  tenderness.  Skin: Skin is warm and dry. No rash noted. No erythema.  Psychiatric: He has a normal mood and affect. His behavior is normal.        Assessment & Plan:  Hepatic abscess= finish 2 more weeks of cipro and metronidazole to complete course of therapy.   rtc prn

## 2015-12-16 IMAGING — CT CT GUIDANCE NEEDLE PLACEMENT
1 of 3 series · 12 of 32 positions shown, 18 images · non-contrast
Comparison: none

CLINICAL DATA: Upper abdominal pain and fever, improving. Recent CT
revealed septated hepatic segment 4A collection, possible abscess.

[Series 2: i-spiral 5.0 b40f · axial · 0.77mm/px · z∈[-155,+10]mm · 12 of 57 slices shown, 18 images]
[im 5/57  soft-tissue]
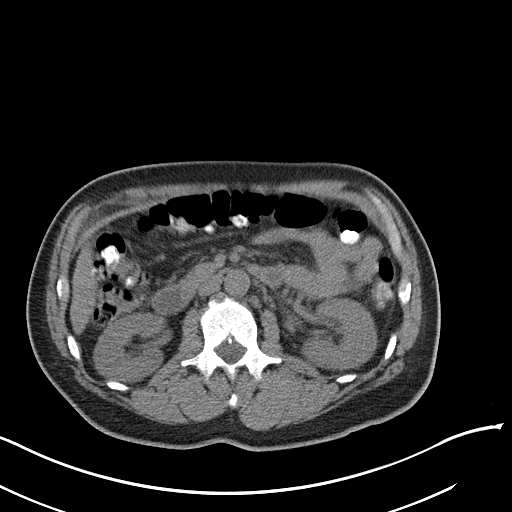
[im 5/57  bone]
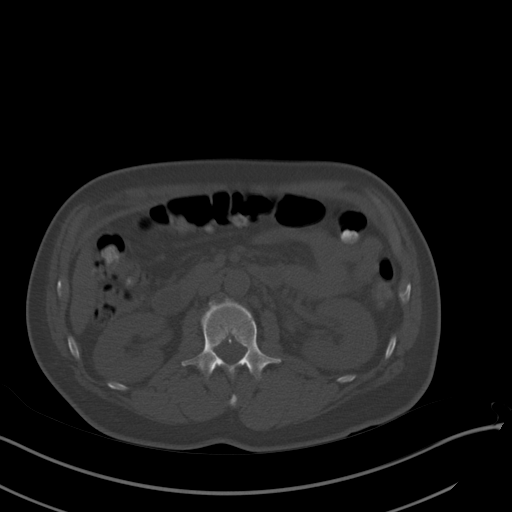
[im 9/57  soft-tissue]
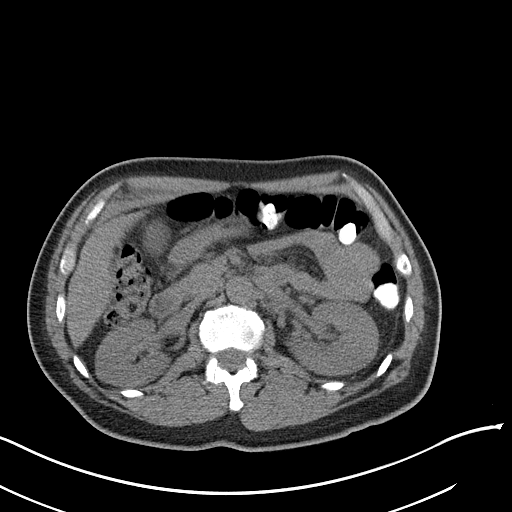
[im 13/57  soft-tissue]
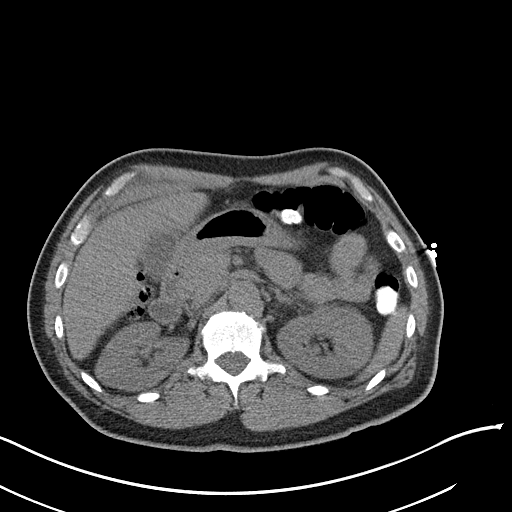
[im 18/57  soft-tissue]
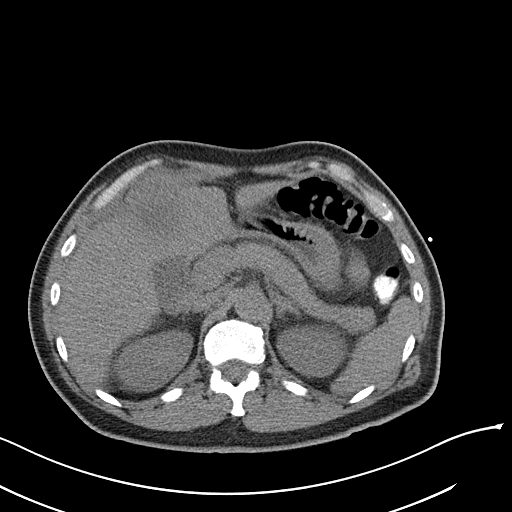
[im 22/57  soft-tissue]
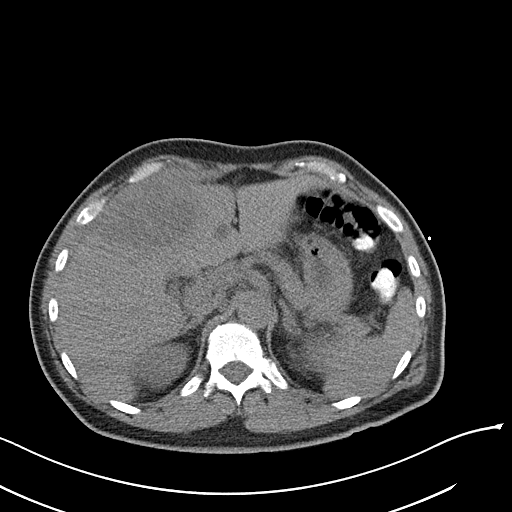
[im 26/57  soft-tissue]
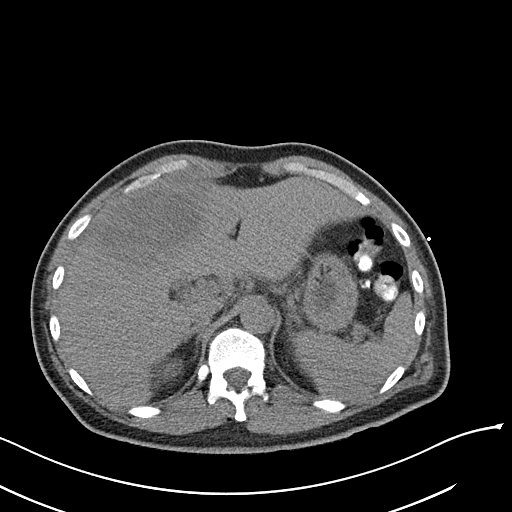
[im 31/57  soft-tissue]
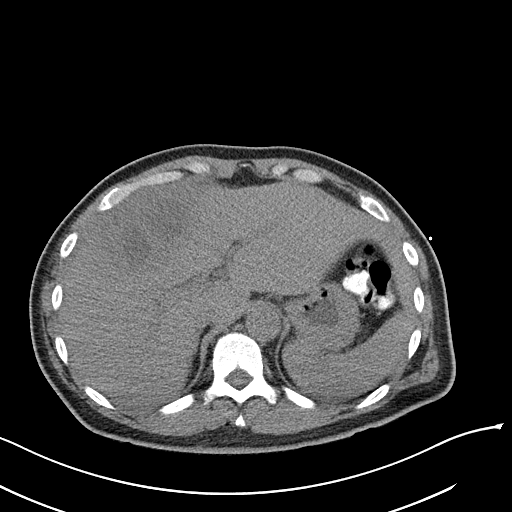
[im 35/57  soft-tissue]
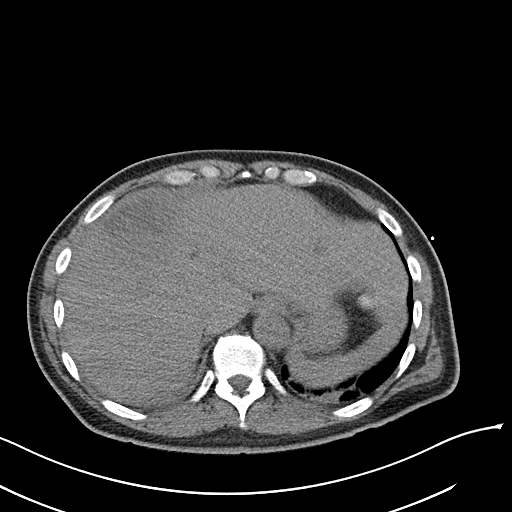
[im 39/57  soft-tissue]
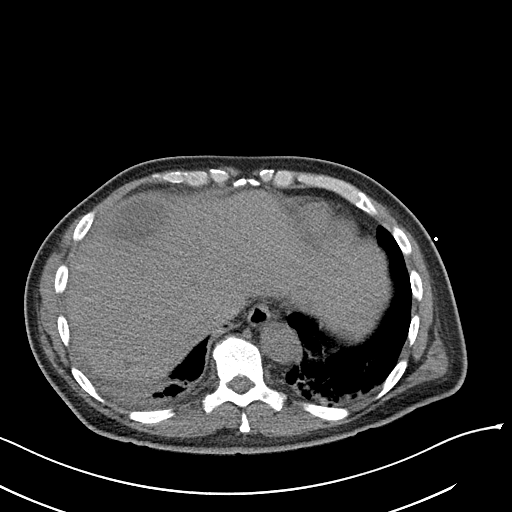
[im 39/57  lung]
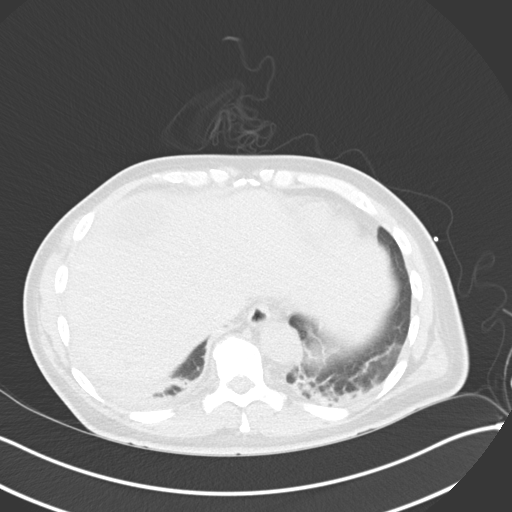
[im 39/57  bone]
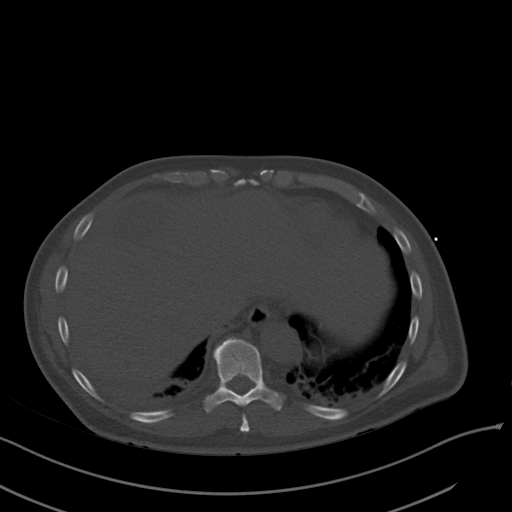
[im 44/57  soft-tissue]
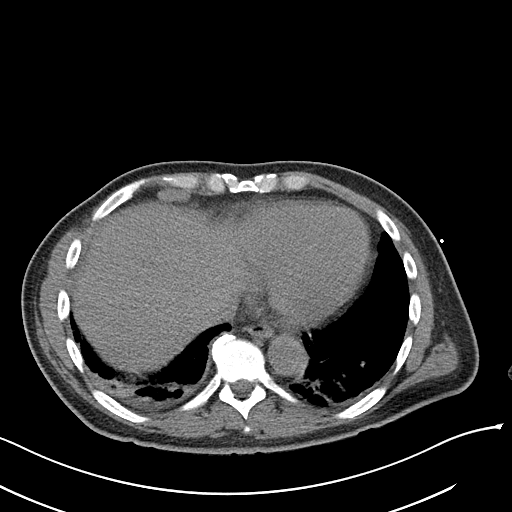
[im 44/57  lung]
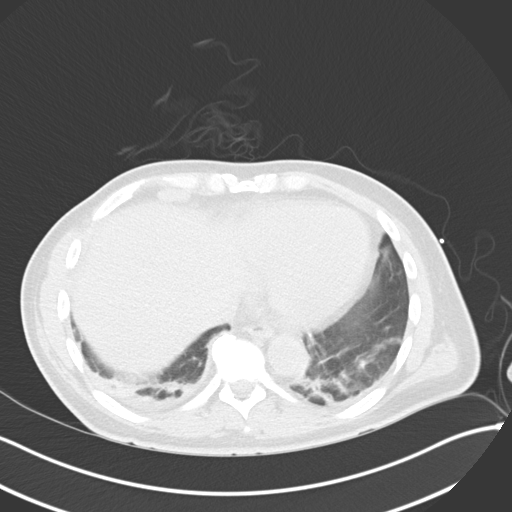
[im 48/57  soft-tissue]
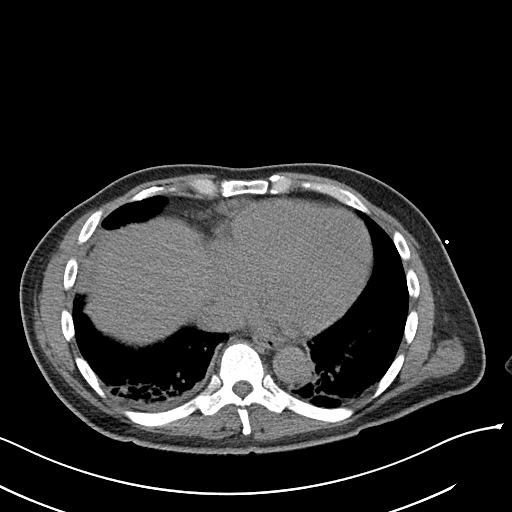
[im 48/57  lung]
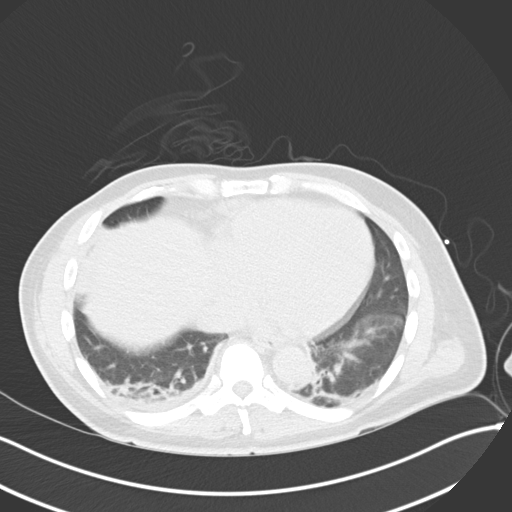
[im 52/57  soft-tissue]
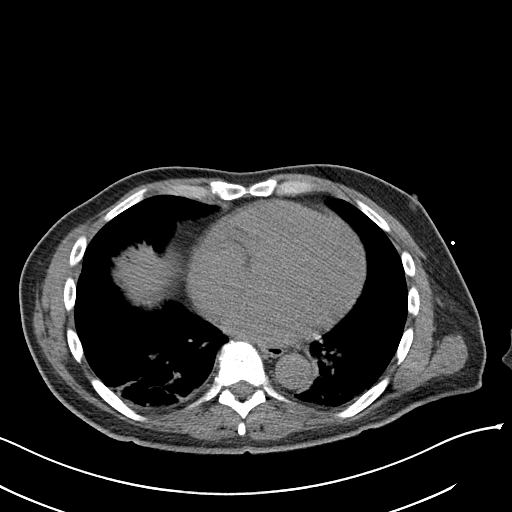
[im 52/57  lung]
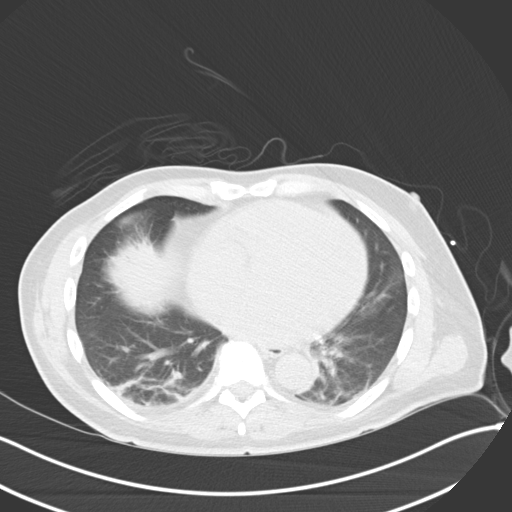

[12 of 32 positions shown; findings below may reference images not displayed]

EXAM:
CT GUIDED NEEDLE ASPIRATE BIOPSY OF LIVER LESION

ANESTHESIA/SEDATION:
Intravenous Fentanyl and Versed were administered as conscious
sedation during continuous cardiorespiratory monitoring by the
radiology RN, with a total moderate sedation time of 10 minutes.

PROCEDURE:
The procedure risks, benefits, and alternatives were explained to
the patient. Questions regarding the procedure were encouraged and
answered. The patient understands and consents to the procedure.

The right upper abdomen was prepped with Betadinein a sterile
fashion, and a sterile drape was applied covering the operative
field. A sterile gown and sterile gloves were used for the
procedure. Local anesthesia was provided with 1% Lidocaine.

Under CT fluoroscopic guidance, and 18 gauge trocar needle was
advanced into the central low-attenuation component the lesion. Once
needle tip position was confirmed, approximately 80 mL of purulent
malodorous material were aspirated. The needle was removed.
Postprocedure scans show no hemorrhage or other apparent
complication. Significant decompression of the collection since
preprocedure scan, with some residual low-attenuation components.

Complications: None
FINDINGS: The complex multi-septated cystic process in hepatic segment 4A was
again identified. After aspiration, decrease in the dominant
low-attenuation components without hemorrhage.
IMPRESSION: 1. Technically successful CT-guided aspiration of hepatic segment 4A
cystic lesion, returning 80 mL purulent fluid, samples sent for Gram
stain and culture and to cytology.

## 2016-02-07 IMAGING — US US ABDOMEN COMPLETE
1 series · 14 of 25 positions shown · non-contrast
Comparison: CT abdomen 08/09/2013

CLINICAL DATA: Abnormal liver function tests.  Liver abscess.

EXAM:
ULTRASOUND ABDOMEN COMPLETE

[Series 1: us abdomen complete · 0.26mm/px · 14 of 49 slices shown]
[im 1/49]
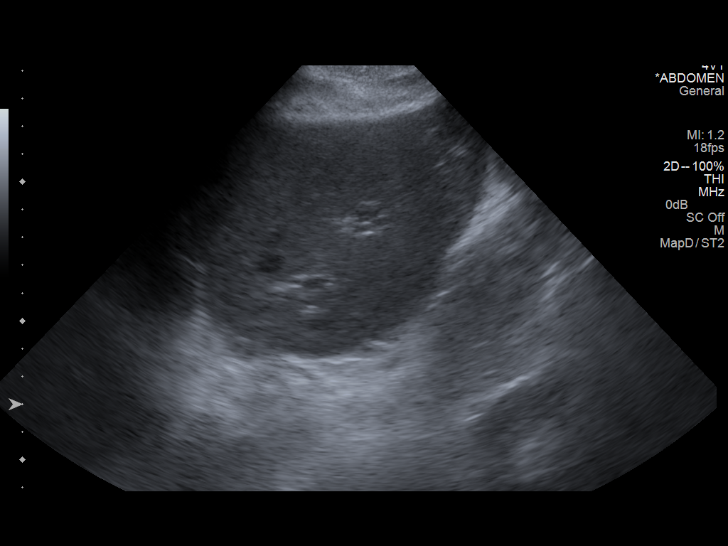
[im 5/49]
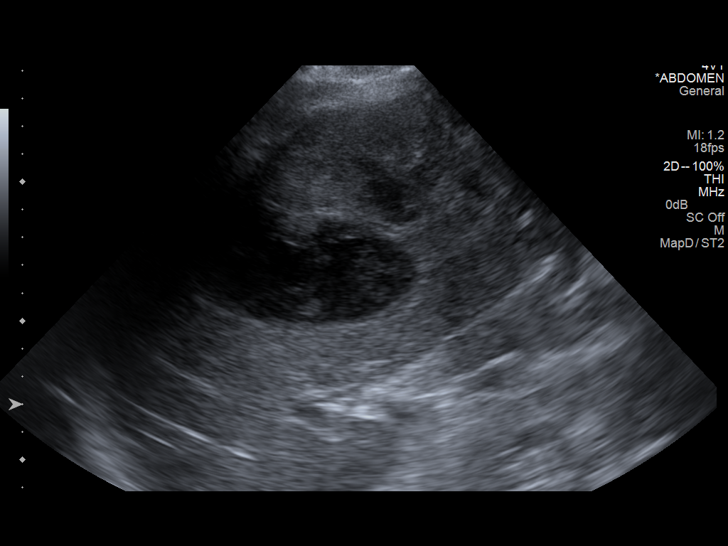
[im 9/49]
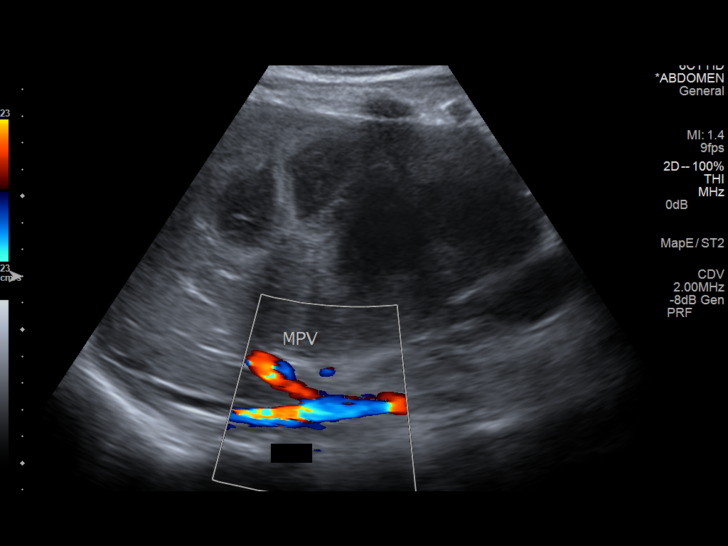
[im 13/49]
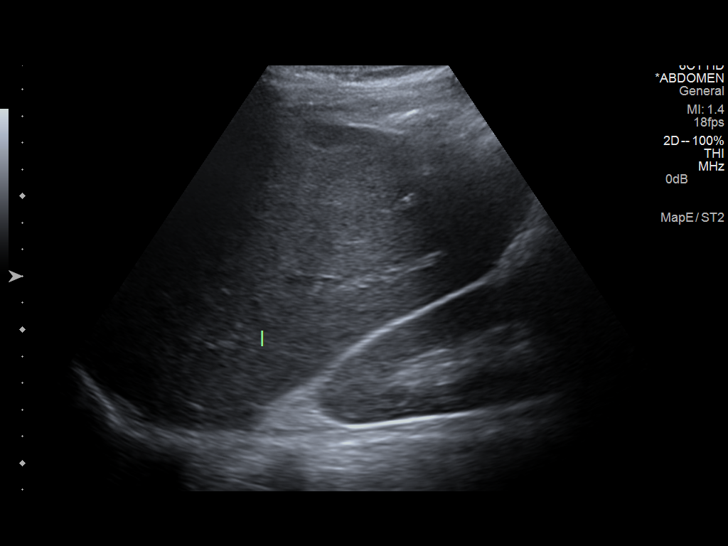
[im 17/49]
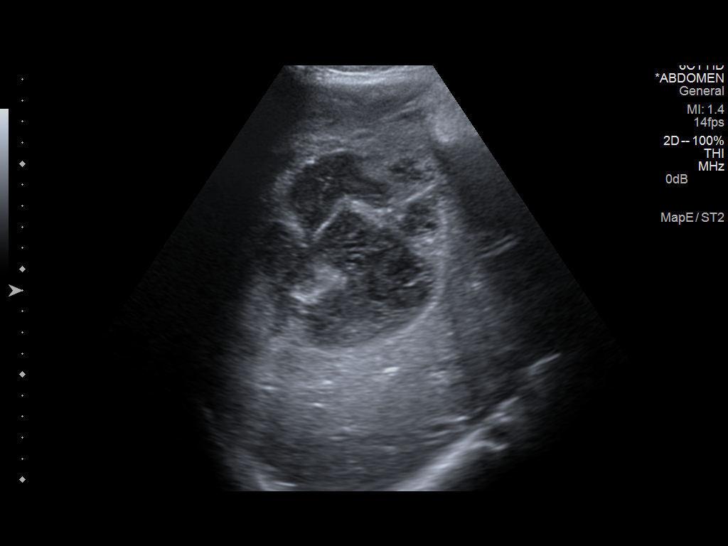
[im 19/49]
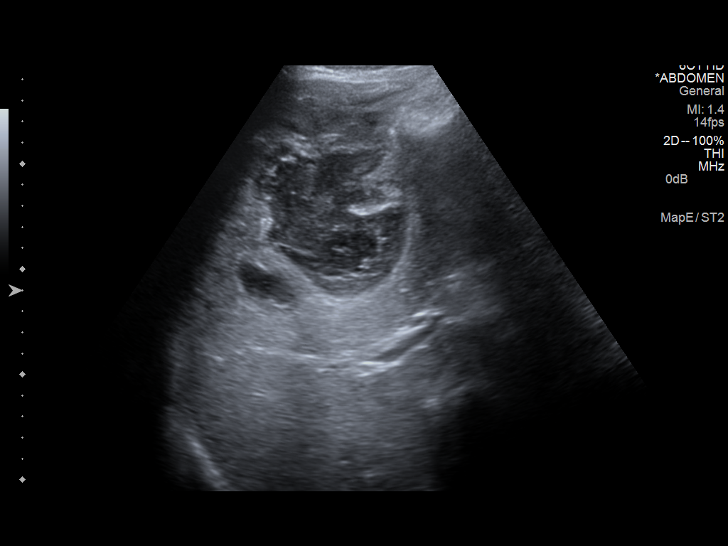
[im 23/49]
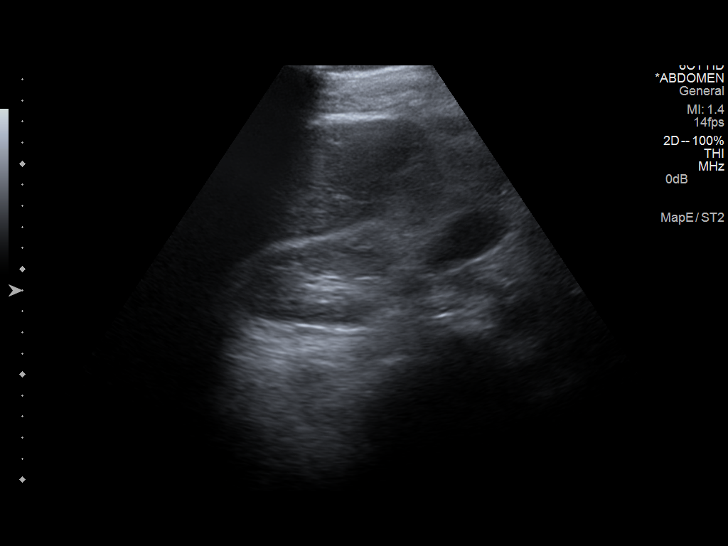
[im 27/49]
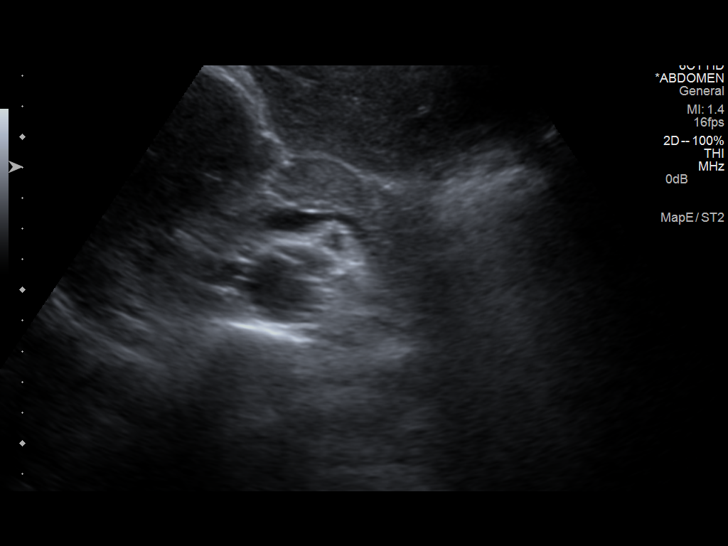
[im 31/49]
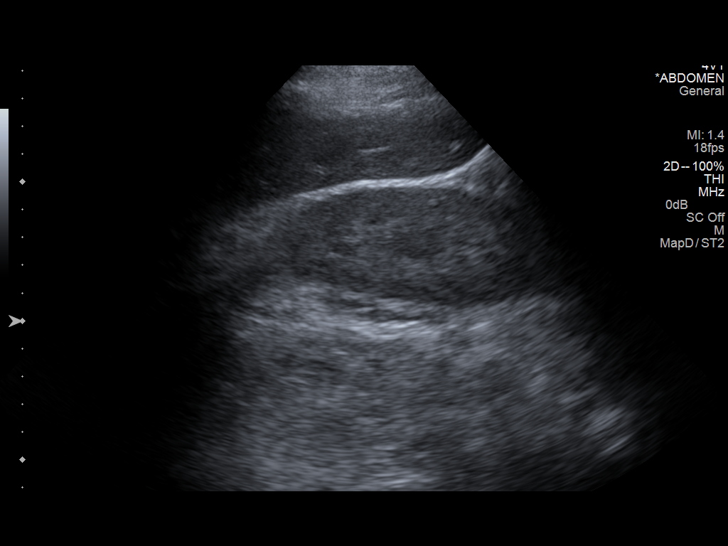
[im 33/49]
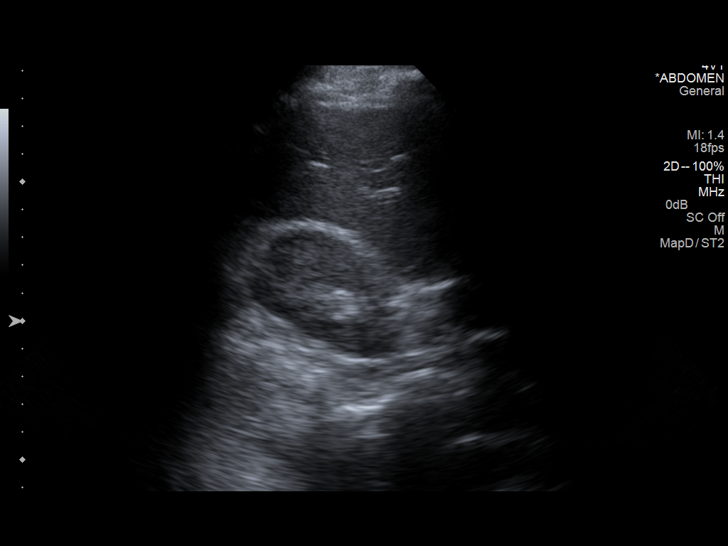
[im 37/49]
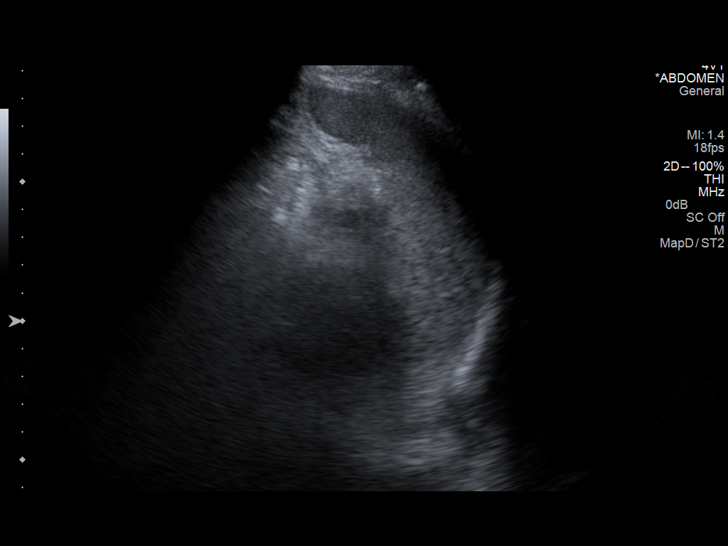
[im 41/49]
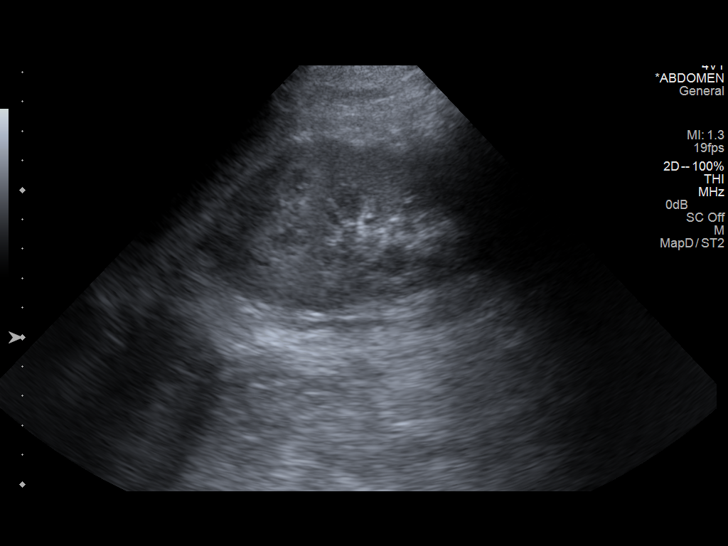
[im 45/49]
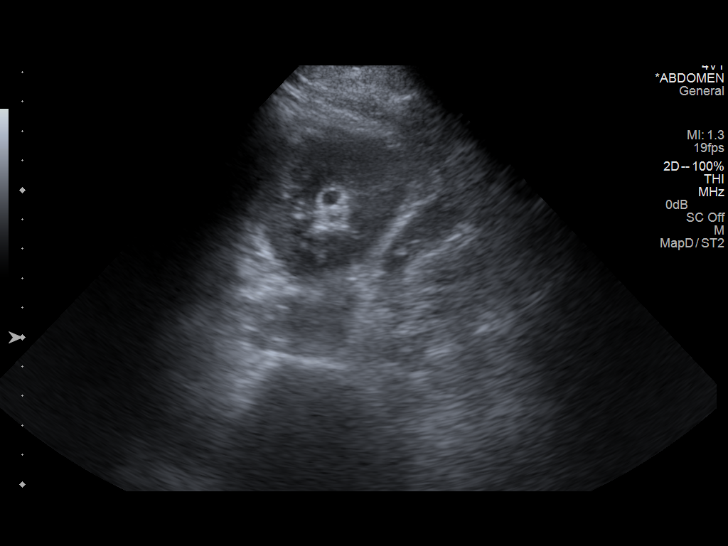
[im 49/49]
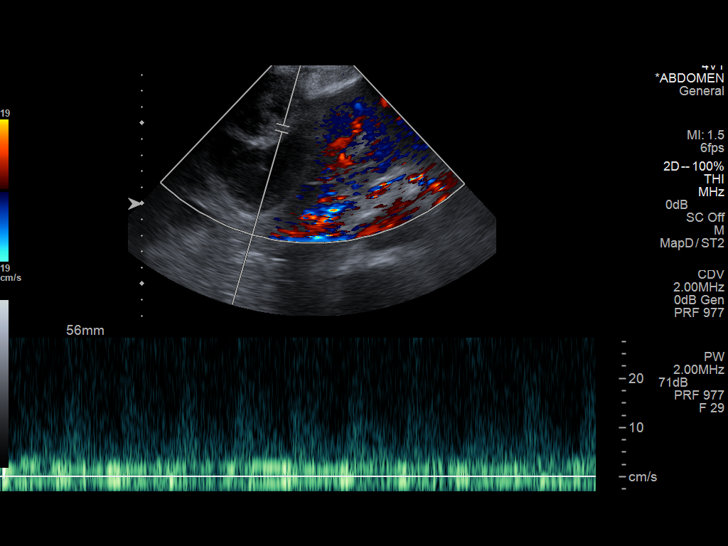

[14 of 25 positions shown; findings below may reference images not displayed]

FINDINGS: Gallbladder:

No gallstones or wall thickening visualized. No sonographic Murphy
sign noted.

Common bile duct:

Diameter: 3 mm

Liver:

As seen on recent CT, there is a large, complex collection involving
both right and left hepatic lobes measuring approximately 13.9 x
x 10.6 cm with some mild peripheral vascularity. Liver was grossly
normal in echogenicity elsewhere. No gross intrahepatic biliary
dilatation identified.

IVC:

No abnormality visualized.

Pancreas:

Not well visualized.

Spleen:

Size and appearance within normal limits.

Right Kidney:

Length: 13.0 cm. Echogenicity within normal limits. No mass or
hydronephrosis visualized.

Left Kidney:

Length: 12.7 cm. Echogenicity within normal limits. No mass or
hydronephrosis visualized.

Abdominal aorta:

No aneurysm visualized.

Other findings:

None.
IMPRESSION: Large, complex collection in the liver, consistent with previously
described abscess.

## 2016-10-04 ENCOUNTER — Encounter: Payer: Self-pay | Admitting: Family Medicine

## 2016-10-04 ENCOUNTER — Ambulatory Visit (INDEPENDENT_AMBULATORY_CARE_PROVIDER_SITE_OTHER): Payer: BLUE CROSS/BLUE SHIELD | Admitting: Family Medicine

## 2016-10-04 VITALS — BP 130/90 | HR 79 | Ht 68.0 in | Wt 188.8 lb

## 2016-10-04 DIAGNOSIS — R030 Elevated blood-pressure reading, without diagnosis of hypertension: Secondary | ICD-10-CM

## 2016-10-04 NOTE — Progress Notes (Signed)
   Subjective:    Patient ID: Aaron Leonard, male    DOB: [birth year], 61 y.o.   MRN: [id number]  HPI He is here after a several year absence. He has not seen anyone in the meantime her and he was seen recently part of a wellness program at work and his blood pressure was elevated. 148/98 He also had blood work done which did show total cholesterol 127, HDL 48, triglycerides 51, LDL 69 nonfasting blood sugar was 89 and he is here for further consultation. Her work keeps him quite physically active. He states that his weight has been stable.   Review of Systems     Objective:   Physical Exam Alert and in no distress. Blood pressure is recorded.       Assessment & Plan:  Elevated blood pressure reading I discussed the diagnosis of hypertension and explained that since he has had 2 readings elevated, I would like to wait 2 months and recheck this in 2 months. Discussed diet, exercise, salt. He will come in for complete examination and blood pressure still elevated, I will treat

## 2021-12-31 ENCOUNTER — Encounter: Payer: Self-pay | Admitting: Family Medicine

## 2021-12-31 ENCOUNTER — Ambulatory Visit (INDEPENDENT_AMBULATORY_CARE_PROVIDER_SITE_OTHER): Payer: 59 | Admitting: Family Medicine

## 2021-12-31 VITALS — BP 148/96 | HR 68 | Temp 97.3°F | Ht 69.0 in | Wt 194.0 lb

## 2021-12-31 DIAGNOSIS — Z13220 Encounter for screening for lipoid disorders: Secondary | ICD-10-CM

## 2021-12-31 DIAGNOSIS — Z23 Encounter for immunization: Secondary | ICD-10-CM | POA: Diagnosis not present

## 2021-12-31 DIAGNOSIS — Z1211 Encounter for screening for malignant neoplasm of colon: Secondary | ICD-10-CM

## 2021-12-31 DIAGNOSIS — R030 Elevated blood-pressure reading, without diagnosis of hypertension: Secondary | ICD-10-CM

## 2021-12-31 DIAGNOSIS — Z1159 Encounter for screening for other viral diseases: Secondary | ICD-10-CM | POA: Diagnosis not present

## 2021-12-31 LAB — LIPID PANEL

## 2021-12-31 LAB — CBC WITH DIFFERENTIAL/PLATELET
Basophils Absolute: 0 10*3/uL (ref 0.0–0.2)
Eos: 2 %
Hemoglobin: 13.7 g/dL (ref 13.0–17.7)
Lymphocytes Absolute: 2.1 10*3/uL (ref 0.7–3.1)
MCH: 29 pg (ref 26.6–33.0)
MCHC: 32.6 g/dL (ref 31.5–35.7)
Neutrophils Absolute: 3.8 10*3/uL (ref 1.4–7.0)
RBC: 4.72 x10E6/uL (ref 4.14–5.80)

## 2021-12-31 LAB — COMPREHENSIVE METABOLIC PANEL

## 2021-12-31 NOTE — Progress Notes (Signed)
   Subjective:    Patient ID: Aaron Leonard, male    DOB: May 17, 1956, 66 y.o.   MRN: [id number]  HPI He is here after a several year absence.  He would like to get reestablished here.  He continues to work.  His wife apparently had a stroke and he is her caregiver however she seems to be fairly functional.  He does not smoke.  Keeps himself physically active.  His work is going well.  He has a previous history of liver abscess as well as some abnormalities of liver functions etc.  He has not had any follow-up since then.   Review of Systems     Objective:   Physical Exam Alert and in no distress otherwise not examined       Assessment & Plan:  Elevated blood pressure reading - Plan: CBC with Differential/Platelet, Comprehensive metabolic panel  Need for Tdap vaccination - Plan: Tdap vaccine greater than or equal to 7yo IM  Need for vaccination against Streptococcus pneumoniae - Plan: Pneumococcal conjugate vaccine 20-valent  Need for hepatitis C screening test - Plan: Hepatitis C antibody  Screening for colon cancer - Plan: Cologuard  Screening for lipid disorders - Plan: Lipid panel He is to set up for complete exam.  We will also be set up to have his shingles vaccines.

## 2022-01-01 LAB — COMPREHENSIVE METABOLIC PANEL
AST: 143 IU/L — ABNORMAL HIGH (ref 0–40)
Alkaline Phosphatase: 94 IU/L (ref 44–121)
BUN/Creatinine Ratio: 16 (ref 10–24)
BUN: 15 mg/dL (ref 8–27)
Calcium: 9.7 mg/dL (ref 8.6–10.2)
Chloride: 102 mmol/L (ref 96–106)
Creatinine, Ser: 0.93 mg/dL (ref 0.76–1.27)
Globulin, Total: 3.6 g/dL (ref 1.5–4.5)
Sodium: 139 mmol/L (ref 134–144)
Total Protein: 8 g/dL (ref 6.0–8.5)

## 2022-01-01 LAB — CBC WITH DIFFERENTIAL/PLATELET
Basos: 0 %
EOS (ABSOLUTE): 0.1 10*3/uL (ref 0.0–0.4)
Hematocrit: 42 % (ref 37.5–51.0)
Immature Grans (Abs): 0 10*3/uL (ref 0.0–0.1)
Immature Granulocytes: 0 %
Lymphs: 31 %
MCV: 89 fL (ref 79–97)
Monocytes Absolute: 0.7 10*3/uL (ref 0.1–0.9)
Monocytes: 11 %
Neutrophils: 56 %
Platelets: 216 10*3/uL (ref 150–450)
RDW: 12.6 % (ref 11.6–15.4)
WBC: 6.8 10*3/uL (ref 3.4–10.8)

## 2022-01-01 LAB — LIPID PANEL
Cholesterol, Total: 122 mg/dL (ref 100–199)
HDL: 45 mg/dL (ref >39)
LDL Chol Calc (NIH): 60 mg/dL (ref 0–99)
VLDL Cholesterol Cal: 17 mg/dL (ref 5–40)

## 2022-01-01 LAB — HEPATITIS C ANTIBODY: Hep C Virus Ab: REACTIVE — AB

## 2022-01-04 ENCOUNTER — Telehealth: Payer: Self-pay | Admitting: Family Medicine

## 2022-01-04 ENCOUNTER — Other Ambulatory Visit: Payer: Self-pay

## 2022-01-04 DIAGNOSIS — R768 Other specified abnormal immunological findings in serum: Secondary | ICD-10-CM

## 2022-01-04 NOTE — Telephone Encounter (Signed)
Order was put in ned co sign. Thanks [employer]

## 2022-01-04 NOTE — Telephone Encounter (Signed)
Pt is coming in for lab work tomorrow. Pt put in orders

## 2022-01-04 NOTE — Telephone Encounter (Signed)
error 

## 2022-01-05 ENCOUNTER — Other Ambulatory Visit: Payer: 59

## 2022-01-07 LAB — HCV RNA QUANT RFLX ULTRA OR GENOTYP
HCV Quant Baseline: 1140000 IU/mL
HCV log10: 6.057 log10 IU/mL

## 2022-01-07 LAB — HEPATITIS C GENOTYPE

## 2022-01-10 ENCOUNTER — Other Ambulatory Visit: Payer: Self-pay

## 2022-01-10 DIAGNOSIS — R768 Other specified abnormal immunological findings in serum: Secondary | ICD-10-CM

## 2022-01-17 ENCOUNTER — Other Ambulatory Visit (HOSPITAL_COMMUNITY): Payer: Self-pay

## 2022-01-17 ENCOUNTER — Telehealth: Payer: Self-pay

## 2022-01-17 NOTE — Telephone Encounter (Signed)
[facility] Encounter  Insurance verification completed.    The patient is insured through [employer].  Medication will need a PA.  We will continue to follow to see if copay assistance is needed.  Demaurion Dicioccio, CPhT [facility] Phone: [phone] Fax:  [phone]  

## 2022-01-18 DIAGNOSIS — R030 Elevated blood-pressure reading, without diagnosis of hypertension: Secondary | ICD-10-CM | POA: Insufficient documentation

## 2022-01-18 DIAGNOSIS — I10 Essential (primary) hypertension: Secondary | ICD-10-CM | POA: Insufficient documentation

## 2022-01-18 DIAGNOSIS — B182 Chronic viral hepatitis C: Secondary | ICD-10-CM | POA: Insufficient documentation

## 2022-01-19 ENCOUNTER — Ambulatory Visit (INDEPENDENT_AMBULATORY_CARE_PROVIDER_SITE_OTHER): Payer: 59 | Admitting: Internal Medicine

## 2022-01-19 ENCOUNTER — Other Ambulatory Visit: Payer: Self-pay

## 2022-01-19 ENCOUNTER — Encounter: Payer: Self-pay | Admitting: Internal Medicine

## 2022-01-19 DIAGNOSIS — R030 Elevated blood-pressure reading, without diagnosis of hypertension: Secondary | ICD-10-CM

## 2022-01-19 DIAGNOSIS — B182 Chronic viral hepatitis C: Secondary | ICD-10-CM

## 2022-01-19 NOTE — Progress Notes (Signed)
[facility]  Reason for Consult:Chronic l hepatitis C Referring Provider: Dr. Sharlot Gowda  Assessment: He has chronic hepatitis C with elevated liver transaminases.  When he had a liver abscess 9 years ago ultrasound and CT scans did not reveal any evidence of cirrhosis or liver mass.  I will check his liver fibrosis score, HIV antibody and hepatitis B surface antigen.  See him back in several weeks to start therapy to cure his hepatitis C.  He is in agreement with that plan.  Plan: Check fibrosis score, HIV antibody and hepatitis B surface antigen Follow-up in 3 weeks  Patient Active Problem List   Diagnosis Date Noted   Chronic hepatitis C without hepatic coma (HCC) 01/18/2022    Priority: High   Elevated blood pressure reading 01/18/2022   Liver abscess 08/09/2013    Patient's Medications  New Prescriptions   No medications on file  Previous Medications   IBUPROFEN (ADVIL) 200 MG TABLET    Take 200 mg by mouth every 6 (six) hours as needed.   NAPROXEN SODIUM (ALEVE) 220 MG TABLET    Take 220 mg by mouth.  Modified Medications   No medications on file  Discontinued Medications   No medications on file    HPI: Aaron Leonard is a 66 y.o. male with recently diagnosed chronic hepatitis C.  He has a history of liver abscess diagnosed and treated in 2014 and 2015.  Otherwise he has been in very good health.  He was recently found to have elevated liver enzymes.  His hepatitis C antibody was positive and his hepatitis C viral load was 1,140,000.  He has genotype Ia.  He has been hospitalized on 2 occasions.  A laceration to his left knee and he was hospitalized when he had his liver abscess 9 years ago.  He has never had a blood transfusion.  He says that he did have 1 episode of injecting drug use in the 1970s.  Review of Systems: Review of Systems  Constitutional:  Negative for fever, malaise/fatigue and weight loss.  Gastrointestinal:   Negative for abdominal pain, diarrhea, nausea and vomiting.      Past Medical History:  Diagnosis Date   Allergy    RHINITIS   Congenital anomalies of the testes    ED (erectile dysfunction)    Medical history non-contributory     Social History   Tobacco Use   Smoking status: Never   Smokeless tobacco: Never  Substance Use Topics   Alcohol use: No   Drug use: No    No family history on file. No Known Allergies  OBJECTIVE: Vitals:   01/19/22 0851  BP: (!) 163/103  Pulse: 90  Temp: 97.7 F (36.5 C)  TempSrc: Oral  SpO2: 98%  Weight: 191 lb (86.6 kg)   Body mass index is 28.21 kg/m.   Physical Exam Constitutional:      Comments: He is very pleasant and in good spirits.  Cardiovascular:     Rate and Rhythm: Normal rate and regular rhythm.     Heart sounds: No murmur heard. Pulmonary:     Effort: Pulmonary effort is normal.     Breath sounds: Normal breath sounds.  Abdominal:     Palpations: Abdomen is soft. There is no mass.     Tenderness: There is no abdominal tenderness.  Psychiatric:        Mood and Affect: Mood normal.     Microbiology:  No results found for this or any previous visit (from the past 240 hour(s)).  Cliffton Asters, MD [facility] [phone] pager   [phone] cell 01/19/2022, 9:30 AM

## 2022-01-24 LAB — LIVER FIBROSIS, FIBROTEST-ACTITEST
ALT: 126 U/L — ABNORMAL HIGH (ref 9–46)
Alpha-2-Macroglobulin: 381 mg/dL — ABNORMAL HIGH (ref 106–279)
Apolipoprotein A1: 169 mg/dL (ref 94–176)
Bilirubin: 0.5 mg/dL (ref 0.2–1.2)
Fibrosis Score: 0.75
GGT: 172 U/L — ABNORMAL HIGH (ref 3–70)
Haptoglobin: 104 mg/dL (ref 43–212)
Necroinflammat ACT Score: 0.79
Reference ID: 4476440

## 2022-01-24 LAB — HIV ANTIBODY (ROUTINE TESTING W REFLEX): HIV 1&2 Ab, 4th Generation: NONREACTIVE

## 2022-01-24 LAB — HEPATITIS B SURFACE ANTIGEN: Hepatitis B Surface Ag: NONREACTIVE

## 2022-01-31 ENCOUNTER — Other Ambulatory Visit (INDEPENDENT_AMBULATORY_CARE_PROVIDER_SITE_OTHER): Payer: 59

## 2022-01-31 DIAGNOSIS — Z23 Encounter for immunization: Secondary | ICD-10-CM

## 2022-02-09 ENCOUNTER — Encounter: Payer: Self-pay | Admitting: Internal Medicine

## 2022-02-09 ENCOUNTER — Ambulatory Visit (INDEPENDENT_AMBULATORY_CARE_PROVIDER_SITE_OTHER): Payer: 59 | Admitting: Internal Medicine

## 2022-02-09 ENCOUNTER — Telehealth: Payer: Self-pay | Admitting: Family Medicine

## 2022-02-09 ENCOUNTER — Other Ambulatory Visit: Payer: Self-pay

## 2022-02-09 DIAGNOSIS — B182 Chronic viral hepatitis C: Secondary | ICD-10-CM

## 2022-02-09 DIAGNOSIS — R030 Elevated blood-pressure reading, without diagnosis of hypertension: Secondary | ICD-10-CM

## 2022-02-09 NOTE — Telephone Encounter (Signed)
Aaron Leonard came in the office today and stated he left Dr.Campbell and his blood pressure was high and was told to schedule an appointment with you about this. I put him on the schedule for Friday 02/18/22. His last bp reading was 178/120 and visit before that was 163/103

## 2022-02-09 NOTE — Progress Notes (Signed)
[facility]  Patient Active Problem List   Diagnosis Date Noted   Chronic hepatitis C without hepatic coma (HCC) 01/18/2022    Priority: High   Elevated blood pressure reading 01/18/2022   Liver abscess 08/09/2013    Patient's Medications  New Prescriptions   No medications on file  Previous Medications   IBUPROFEN (ADVIL) 200 MG TABLET    Take 200 mg by mouth every 6 (six) hours as needed.   NAPROXEN SODIUM (ALEVE) 220 MG TABLET    Take 220 mg by mouth.  Modified Medications   No medications on file  Discontinued Medications   No medications on file    Subjective: Aaron Leonard is in for his routine hepatitis C follow-up visit.  He is feeling well.  Review of Systems: Review of Systems  Constitutional:  Negative for fever and weight loss.  Gastrointestinal:  Negative for abdominal pain and heartburn.    Past Medical History:  Diagnosis Date   Allergy    RHINITIS   Congenital anomalies of the testes    ED (erectile dysfunction)    Medical history non-contributory     Social History   Tobacco Use   Smoking status: Never   Smokeless tobacco: Never  Substance Use Topics   Alcohol use: No   Drug use: No    No family history on file.  No Known Allergies  Objective: Vitals:   02/09/22 0853  BP: (!) 178/120  Pulse: 74  Resp: 16  Temp: 98 F (36.7 C)  SpO2: 98%  Weight: 194 lb (88 kg)  Height: 5\' 9"  (1.753 m)   Body mass index is 28.65 kg/m.  Physical Exam Constitutional:      Comments: He is calm and pleasant.  Cardiovascular:     Rate and Rhythm: Normal rate and regular rhythm.     Heart sounds: No murmur heard. Pulmonary:     Effort: Pulmonary effort is normal.     Breath sounds: Normal breath sounds.  Abdominal:     Palpations: Abdomen is soft. There is no mass.     Tenderness: There is no abdominal tenderness.  Psychiatric:        Mood and Affect: Mood normal.     Lab Results    Problem List Items  Addressed This Visit       High   Chronic hepatitis C without hepatic coma (HCC)    He has chronic hepatitis C.  His liver enzymes have been elevated recently.  He has genotype 1a and hepatitis C viral load of 1,140,000.  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  When he was treated for a liver abscess in 2015, his ultrasound and CT scans did not show any evidence of cirrhosis or hepatic tumor.  I will obtain an abdominal ultrasound now given his increased risk for cirrhosis and hepatocellular carcinoma before making any decision about treatment for his chronic hepatitis C.  He will follow-up in 4 weeks after the ultrasound.      Relevant Orders   2016 Abdomen Complete     Unprioritized   Elevated blood pressure reading    His last 3 blood pressure readings have been quite elevated.  He has never required antihypertensive therapy.  I asked him to schedule a follow-up visit with his PCP as soon as possible.        [place], MD [facility] [phone] pager  [phone] cell 02/09/2022, 9:11 AM

## 2022-02-09 NOTE — Assessment & Plan Note (Signed)
His last 3 blood pressure readings have been quite elevated.  He has never required antihypertensive therapy.  I asked him to schedule a follow-up visit with his PCP as soon as possible.

## 2022-02-09 NOTE — Assessment & Plan Note (Signed)
He has chronic hepatitis C.  His liver enzymes have been elevated recently.  He has genotype 1a and hepatitis C viral load of 1,140,000.  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  When he was treated for a liver abscess in 2015, his ultrasound and CT scans did not show any evidence of cirrhosis or hepatic tumor.  I will obtain an abdominal ultrasound now given his increased risk for cirrhosis and hepatocellular carcinoma before making any decision about treatment for his chronic hepatitis C.  He will follow-up in 4 weeks after the ultrasound.

## 2022-02-11 ENCOUNTER — Ambulatory Visit
Admission: RE | Admit: 2022-02-11 | Discharge: 2022-02-11 | Disposition: A | Discharge status: Home / Self Care | Payer: 59 | Source: Ambulatory Visit | Attending: Internal Medicine | Admitting: Internal Medicine

## 2022-02-11 DIAGNOSIS — B182 Chronic viral hepatitis C: Secondary | ICD-10-CM

## 2022-02-13 LAB — COLOGUARD: COLOGUARD: POSITIVE — AB

## 2022-02-16 ENCOUNTER — Encounter: Payer: Self-pay | Admitting: Internal Medicine

## 2022-02-16 ENCOUNTER — Other Ambulatory Visit: Payer: Self-pay | Admitting: Internal Medicine

## 2022-02-16 ENCOUNTER — Telehealth: Payer: Self-pay

## 2022-02-16 DIAGNOSIS — R1909 Other intra-abdominal and pelvic swelling, mass and lump: Secondary | ICD-10-CM

## 2022-02-16 DIAGNOSIS — R1900 Intra-abdominal and pelvic swelling, mass and lump, unspecified site: Secondary | ICD-10-CM | POA: Insufficient documentation

## 2022-02-16 NOTE — Telephone Encounter (Signed)
Called patient to let him know that US showed a mass that needs further evaluation. Dr. Orvan Falconer has ordered a CT to further evaluate. No answer. Left HIPAA compliant voicemail requesting callback.   Sandie Ano, RN

## 2022-02-18 ENCOUNTER — Ambulatory Visit (INDEPENDENT_AMBULATORY_CARE_PROVIDER_SITE_OTHER): Payer: 59 | Admitting: Family Medicine

## 2022-02-18 ENCOUNTER — Encounter: Payer: Self-pay | Admitting: Family Medicine

## 2022-02-18 ENCOUNTER — Other Ambulatory Visit: Payer: Self-pay

## 2022-02-18 VITALS — BP 156/96 | HR 84 | Temp 97.9°F | Wt 195.0 lb

## 2022-02-18 DIAGNOSIS — I10 Essential (primary) hypertension: Secondary | ICD-10-CM | POA: Diagnosis not present

## 2022-02-18 DIAGNOSIS — R195 Other fecal abnormalities: Secondary | ICD-10-CM

## 2022-02-18 MED ORDER — LOSARTAN POTASSIUM-HCTZ 50-12.5 MG PO TABS: 1.0000 | ORAL_TABLET | Freq: Every day | ORAL | 1 refills | Status: DC

## 2022-02-18 NOTE — Progress Notes (Signed)
Positive Cologuard  Subjective:    Patient ID: Aaron Leonard, male    DOB: 12-28-1955, 66 y.o.   MRN: [id number]  HPI He is here for consult concerning his blood pressure.  Review of the record indicates that he has had elevated blood pressure over the last several months.  Prior to that his blood pressure was in the normal range. He also recently had a Cologuard test which was positive and has now been referred for colonoscopy.   Review of Systems     Objective:   Physical Exam Alert and in no distress. EKG read by me shows a sinus rate of 74 with other parameters being entirely normal.      Assessment & Plan:  Primary hypertension - Plan: EKG 12-Lead  Positive colorectal cancer screening using Cologuard test Losartan/HCTZ was called in.  Instructed him to take it daily in the morning and return here in 1 month for follow-up and get a blood pressure cuff to compare against ours.

## 2022-02-18 NOTE — Telephone Encounter (Signed)
Attempted to call patient to relay message and initiate CT scheduling. No answer at mobile number; voicemail left on prior attempt. No answer at home phone number; voicemailbox full and unable to leave message.  Wyvonne Lenz, RN

## 2022-02-18 NOTE — Patient Instructions (Signed)
Hypertension, Adult ?Hypertension is another name for high blood pressure. High blood pressure forces your heart to work harder to pump blood. This can cause problems over time. ?There are two numbers in a blood pressure reading. There is a top number (systolic) over a bottom number (diastolic). It is best to have a blood pressure that is below 120/80. ?What are the causes? ?The cause of this condition is not known. Some other conditions can lead to high blood pressure. ?What increases the risk? ?Some lifestyle factors can make you more likely to develop high blood pressure: ?Smoking. ?Not getting enough exercise or physical activity. ?Being overweight. ?Having too much fat, sugar, calories, or salt (sodium) in your diet. ?Drinking too much alcohol. ?Other risk factors include: ?Having any of these conditions: ?Heart disease. ?Diabetes. ?High cholesterol. ?Kidney disease. ?Obstructive sleep apnea. ?Having a family history of high blood pressure and high cholesterol. ?Age. The risk increases with age. ?Stress. ?What are the signs or symptoms? ?High blood pressure may not cause symptoms. Very high blood pressure (hypertensive crisis) may cause: ?Headache. ?Fast or uneven heartbeats (palpitations). ?Shortness of breath. ?Nosebleed. ?Vomiting or feeling like you may vomit (nauseous). ?Changes in how you see. ?Very bad chest pain. ?Feeling dizzy. ?Seizures. ?How is this treated? ?This condition is treated by making healthy lifestyle changes, such as: ?Eating healthy foods. ?Exercising more. ?Drinking less alcohol. ?Your doctor may prescribe medicine if lifestyle changes do not help enough and if: ?Your top number is above 130. ?Your bottom number is above 80. ?Your personal target blood pressure may vary. ?Follow these instructions at home: ?Eating and drinking ? ?If told, follow the DASH eating plan. To follow this plan: ?Fill one half of your plate at each meal with fruits and vegetables. ?Fill one fourth of your plate  at each meal with whole grains. Whole grains include whole-wheat pasta, brown rice, and whole-grain bread. ?Eat or drink low-fat dairy products, such as skim milk or low-fat yogurt. ?Fill one fourth of your plate at each meal with low-fat (lean) proteins. Low-fat proteins include fish, chicken without skin, eggs, beans, and tofu. ?Avoid fatty meat, cured and processed meat, or chicken with skin. ?Avoid pre-made or processed food. ?Limit the amount of salt in your diet to less than 1,500 mg each day. ?Do not drink alcohol if: ?Your doctor tells you not to drink. ?You are pregnant, may be pregnant, or are planning to become pregnant. ?If you drink alcohol: ?Limit how much you have to: ?0-1 drink a day for women. ?0-2 drinks a day for men. ?Know how much alcohol is in your drink. In the [place] one drink equals one 12 oz bottle of beer (355 mL), one 5 oz glass of wine (148 mL), or one 1? oz glass of hard liquor (44 mL). ?Lifestyle ? ?Work with your doctor to stay at a healthy weight or to lose weight. Ask your doctor what the best weight is for you. ?Get at least 30 minutes of exercise that causes your heart to beat faster (aerobic exercise) most days of the week. This may include walking, swimming, or biking. ?Get at least 30 minutes of exercise that strengthens your muscles (resistance exercise) at least 3 days a week. This may include lifting weights or doing Pilates. ?Do not smoke or use any products that contain nicotine or tobacco. If you need help quitting, ask your doctor. ?Check your blood pressure at home as told by your doctor. ?Keep all follow-up visits. ?Medicines ?Take over-the-counter and prescription medicines   only as told by your doctor. Follow directions carefully. ?Do not skip doses of blood pressure medicine. The medicine does not work as well if you skip doses. Skipping doses also puts you at risk for problems. ?Ask your doctor about side effects or reactions to medicines that you should watch  for. ?Contact a doctor if: ?You think you are having a reaction to the medicine you are taking. ?You have headaches that keep coming back. ?You feel dizzy. ?You have swelling in your ankles. ?You have trouble with your vision. ?Get help right away if: ?You get a very bad headache. ?You start to feel mixed up (confused). ?You feel weak or numb. ?You feel faint. ?You have very bad pain in your: ?Chest. ?Belly (abdomen). ?You vomit more than once. ?You have trouble breathing. ?These symptoms may be an emergency. Get help right away. Call 911. ?Do not wait to see if the symptoms will go away. ?Do not drive yourself to the hospital. ?Summary ?Hypertension is another name for high blood pressure. ?High blood pressure forces your heart to work harder to pump blood. ?For most people, a normal blood pressure is less than 120/80. ?Making healthy choices can help lower blood pressure. If your blood pressure does not get lower with healthy choices, you may need to take medicine. ?This information is not intended to replace advice given to you by your health care provider. Make sure you discuss any questions you have with your health care provider. ?Document Revised: 04/01/2021 Document Reviewed: 04/01/2021 ?Elsevier Patient Education ? 2023 Elsevier Inc. ? ?

## 2022-02-21 NOTE — Telephone Encounter (Signed)
Have been unsuccessful in reaching patient. Letter mailed to address on file requesting call back to nursing staff.   Sandie Ano, RN

## 2022-02-22 ENCOUNTER — Other Ambulatory Visit: Payer: Self-pay | Admitting: Internal Medicine

## 2022-02-22 DIAGNOSIS — R1900 Intra-abdominal and pelvic swelling, mass and lump, unspecified site: Secondary | ICD-10-CM

## 2022-03-18 ENCOUNTER — Inpatient Hospital Stay: Admission: RE | Admit: 2022-03-18 | Payer: 59 | Source: Ambulatory Visit

## 2022-03-21 ENCOUNTER — Ambulatory Visit: Payer: 59 | Admitting: Family Medicine

## 2022-03-23 ENCOUNTER — Ambulatory Visit: Payer: 59 | Admitting: Internal Medicine

## 2022-03-30 ENCOUNTER — Encounter: Payer: Self-pay | Admitting: Internal Medicine

## 2022-03-30 ENCOUNTER — Other Ambulatory Visit: Payer: Self-pay

## 2022-03-30 ENCOUNTER — Ambulatory Visit (INDEPENDENT_AMBULATORY_CARE_PROVIDER_SITE_OTHER): Payer: 59 | Admitting: Internal Medicine

## 2022-03-30 DIAGNOSIS — B182 Chronic viral hepatitis C: Secondary | ICD-10-CM

## 2022-03-30 NOTE — Assessment & Plan Note (Signed)
I reviewed the findings from his recent abdominal ultrasound and which showed a soft tissue mass around the head of his pancreas.  Radiology recommended a follow-up CT scan.  He will meet with our scheduler today to provide information to allow the CT to be scheduled with.  He will follow-up here in 4 weeks.

## 2022-03-30 NOTE — Progress Notes (Signed)
        [place]  Patient Active Problem List   Diagnosis Date Noted   Chronic hepatitis C without hepatic coma ([place]) 01/18/2022    Priority: High   Abdominal mass 02/16/2022   Hypertension 01/18/2022   Liver abscess 08/09/2013    Patient's Medications  New Prescriptions   No medications on file  Previous Medications   IBUPROFEN (ADVIL) 200 MG TABLET    Take 200 mg by mouth every 6 (six) hours as needed.   LOSARTAN-HYDROCHLOROTHIAZIDE (HYZAAR) 50-12.5 MG TABLET    Take 1 tablet by mouth daily.   NAPROXEN SODIUM (ALEVE) 220 MG TABLET    Take 220 mg by mouth.  Modified Medications   No medications on file  Discontinued Medications   No medications on file    Subjective: Aaron Leonard is in for his routine hepatitis C follow-up.  He is in the process of moving from [facility] to [employer].  He has not gotten any of the phone messages and does not recall getting a letter asking him to call about the abdominal CT scan I ordered.  Other than being stressed about his move he is feeling well.  He is a little tired now but that, he says, is normal from his third shift work.  Review of Systems: Review of Systems  Constitutional:  Negative for weight loss.  Gastrointestinal:  Negative for abdominal pain, constipation, diarrhea, nausea and vomiting.    Past Medical History:  Diagnosis Date   Allergy    RHINITIS   Congenital anomalies of the testes    ED (erectile dysfunction)    Medical history non-contributory     Social History   Tobacco Use   Smoking status: Never   Smokeless tobacco: Never  Substance Use Topics   Alcohol use: No   Drug use: No    No family history on file.  No Known Allergies  Objective: Vitals:   03/30/22 0859  BP: (!) 144/90  Pulse: 68  Resp: 16  SpO2: 100%  Weight: 192 lb (87.1 kg)  Height: 5\' 9"  (1.753 m)   Body mass index is 28.35 kg/m.  Physical Exam Constitutional:      Comments: He is pleasant and in  good spirits.  Abdominal:     Palpations: Abdomen is soft. There is no mass.     Tenderness: There is no abdominal tenderness.  Psychiatric:        Mood and Affect: Mood normal.     Lab Results    Problem List Items Addressed This Visit       High   Chronic hepatitis C without hepatic coma ([place])    I reviewed the findings from his recent abdominal ultrasound and which showed a soft tissue mass around the head of his pancreas.  Radiology recommended a follow-up CT scan.  He will meet with our scheduler today to provide information to allow the CT to be scheduled with.  He will follow-up here in 4 weeks.        Aaron Bickers, MD [facility] [phone] pager   [phone] cell 03/30/2022, 9:19 AM

## 2022-03-31 ENCOUNTER — Encounter: Payer: Self-pay | Admitting: Family Medicine

## 2022-04-04 ENCOUNTER — Other Ambulatory Visit (INDEPENDENT_AMBULATORY_CARE_PROVIDER_SITE_OTHER): Payer: 59

## 2022-04-04 DIAGNOSIS — Z23 Encounter for immunization: Secondary | ICD-10-CM

## 2022-04-20 ENCOUNTER — Other Ambulatory Visit: Payer: Self-pay | Admitting: Family Medicine

## 2022-04-22 ENCOUNTER — Ambulatory Visit
Admission: RE | Admit: 2022-04-22 | Discharge: 2022-04-22 | Disposition: A | Discharge status: Home / Self Care | Payer: 59 | Source: Ambulatory Visit | Attending: Internal Medicine | Admitting: Internal Medicine

## 2022-04-22 DIAGNOSIS — R1900 Intra-abdominal and pelvic swelling, mass and lump, unspecified site: Secondary | ICD-10-CM

## 2022-04-22 MED ORDER — IOPAMIDOL (ISOVUE-300) INJECTION 61%
100.0000 mL | Freq: Once | INTRAVENOUS | Status: AC | PRN
  Administered 2022-04-22: 100 mL via INTRAVENOUS

## 2022-04-27 ENCOUNTER — Other Ambulatory Visit (HOSPITAL_COMMUNITY): Payer: Self-pay

## 2022-04-27 ENCOUNTER — Ambulatory Visit (INDEPENDENT_AMBULATORY_CARE_PROVIDER_SITE_OTHER): Payer: 59 | Admitting: Internal Medicine

## 2022-04-27 ENCOUNTER — Encounter: Payer: Self-pay | Admitting: Internal Medicine

## 2022-04-27 ENCOUNTER — Other Ambulatory Visit: Payer: Self-pay

## 2022-04-27 DIAGNOSIS — B182 Chronic viral hepatitis C: Secondary | ICD-10-CM | POA: Diagnosis not present

## 2022-04-27 MED ORDER — MAVYRET 100-40 MG PO TABS: 3.0000 | ORAL_TABLET | Freq: Every day | ORAL | 1 refills | Status: DC

## 2022-04-27 NOTE — Progress Notes (Signed)
[facility] for [facility]ious Disease  Patient Active Problem List   Diagnosis Date Noted   Chronic hepatitis C without hepatic coma ([place]) 01/18/2022    Priority: High   Abdominal mass 02/16/2022   Hypertension 01/18/2022   Liver abscess 08/09/2013    Patient's Medications  New Prescriptions   GLECAPREVIR-PIBRENTASVIR (MAVYRET) 100-40 MG TABS    Take 3 tablets by mouth daily with breakfast.  Previous Medications   IBUPROFEN (ADVIL) 200 MG TABLET    Take 200 mg by mouth every 6 (six) hours as needed.   LOSARTAN-HYDROCHLOROTHIAZIDE (HYZAAR) 50-12.5 MG TABLET    TAKE 1 TABLET BY MOUTH EVERY DAY   NAPROXEN SODIUM (ALEVE) 220 MG TABLET    Take 220 mg by mouth.  Modified Medications   No medications on file  Discontinued Medications   No medications on file    Subjective: Eldra is in for his routine hepatitis C follow-up.  He has been busy finishing up his move from [place] to [employer].  He underwent an abdominal CT scan on 04/22/2022 which revealed:  IMPRESSION: 1. Mildly enlarged periportal lymph nodes, a nonspecific finding in a patient with history of hepatitis C and morphologic changes of liver disease. These lymph nodes about the porta hepatis likely represent the ultrasound finding and are not substantially changed compared to imaging dating back to 2015. Largest lymph nodes ranging between 1.9 and 2 cm greatest axial dimension. In the setting of cirrhosis these are likely reactive. 2. Multiple hepatic cysts not substantially changed compared to previous imaging with only mild enlargement and continued well-circumscribed margins and water density. 3. 4-5 mm calculus in the interpolar LEFT kidney. 4. RIGHT-sided colonic diverticulosis and scattered diverticulosis elsewhere.  Review of Systems: Review of Systems  Constitutional:  Negative for weight loss.  Gastrointestinal:  Negative for abdominal pain, constipation, diarrhea, nausea and vomiting.     Past Medical History:  Diagnosis Date   Allergy    RHINITIS   Congenital anomalies of the testes    ED (erectile dysfunction)    Medical history non-contributory     Social History   Tobacco Use   Smoking status: Never   Smokeless tobacco: Never  Substance Use Topics   Alcohol use: No   Drug use: No    No family history on file.  No Known Allergies  Objective: Vitals:   04/27/22 0931 04/27/22 0957  BP: (!) 165/102 (!) 149/97  Pulse: 65   Temp: 98 F (36.7 C)   TempSrc: Oral   SpO2: 97%   Weight: 190 lb (86.2 kg)   Height: 5\' 9"  (1.753 m)    Body mass index is 28.06 kg/m.  Physical Exam Constitutional:      Comments: He is pleasant and in good spirits.  Abdominal:     Palpations: Abdomen is soft. There is no mass.     Tenderness: There is no abdominal tenderness.  Psychiatric:        Mood and Affect: Mood normal.     Lab Results Hepatitis C genotype Ia Hepatitis C viral load 1,140,000 Fibrosis score F4 HIV antibody negative Hepatitis B surface antigen negative   Problem List Items Addressed This Visit       High   Chronic hepatitis C without hepatic coma ([place])    I will start him on Mavyret 3 tablets daily with food.  Reviewed how to take the medication and potential side effects and encouraged him to do his best to  not miss a single dose.  He will follow-up in 4 weeks for repeat hepatitis C viral load.      Relevant Medications   Glecaprevir-Pibrentasvir (MAVYRET) 100-40 MG TABS     Cliffton Asters, MD [facility] for Infectious Disease [facility] [phone] pager   [phone] cell 04/27/2022, 10:56 AM

## 2022-04-27 NOTE — Assessment & Plan Note (Signed)
I will start him on Mavyret 3 tablets daily with food.  Reviewed how to take the medication and potential side effects and encouraged him to do his best to not miss a single dose.  He will follow-up in 4 weeks for repeat hepatitis C viral load.

## 2022-04-29 ENCOUNTER — Other Ambulatory Visit (HOSPITAL_COMMUNITY): Payer: Self-pay

## 2022-04-29 ENCOUNTER — Telehealth: Payer: Self-pay

## 2022-04-29 NOTE — Telephone Encounter (Signed)
[facility] Patient Advocate Encounter   Received notification from [employer] that prior authorization for Raeanne Gathers is required.   PA submitted on 04/29/22 [facility] Status is pending    [facility] will continue to follow.   Ileene Patrick, [facility] Patient [facility] for Infectious Disease Phone: [phone] Fax:  [phone]

## 2022-04-30 ENCOUNTER — Other Ambulatory Visit (HOSPITAL_COMMUNITY): Payer: Self-pay

## 2022-05-02 ENCOUNTER — Other Ambulatory Visit (HOSPITAL_COMMUNITY): Payer: Self-pay

## 2022-05-02 ENCOUNTER — Telehealth: Payer: Self-pay

## 2022-05-02 NOTE — Telephone Encounter (Signed)
[facility] Patient Advocate Encounter  Prior Authorization for Aaron Leonard [employer]) has been approved.    PA# [id number] Effective dates: 04/29/22 through 07/22/22   Prescription will need to be sent to [place] for Brand Name.  [facility] will continue to follow.  Ileene Patrick, [facility] Patient [facility] for Infectious Disease Phone: [phone] Fax:  [phone]

## 2022-05-03 ENCOUNTER — Other Ambulatory Visit: Payer: Self-pay | Admitting: Pharmacist

## 2022-05-03 DIAGNOSIS — B182 Chronic viral hepatitis C: Secondary | ICD-10-CM

## 2022-05-03 MED ORDER — EPCLUSA 400-100 MG PO TABS: 1.0000 | ORAL_TABLET | Freq: Every day | ORAL | 2 refills | Status: DC

## 2022-05-03 NOTE — Progress Notes (Signed)
Patient's insurance denied Mavyret; sending Epclusa to [facility].  Alfonse Spruce, PharmD, CPP, BCIDP, [place] Clinical Pharmacist Practitioner Infectious [place] for Infectious Disease

## 2022-05-17 ENCOUNTER — Telehealth: Payer: Self-pay

## 2022-05-17 NOTE — Telephone Encounter (Signed)
Patient called office stating he has not received his Epclusa from the pharmacy yet. Was told by [facility] that [facility] does not have medication yet. Would like for office to reschedule appointment since he has not started medication. Patient not scheduled for follow up. Advise he call office once he received medication to schedule four week follow up. Juanita Laster, RMA

## 2022-05-18 ENCOUNTER — Telehealth: Payer: Self-pay | Admitting: Pharmacist

## 2022-05-18 ENCOUNTER — Other Ambulatory Visit (HOSPITAL_COMMUNITY): Payer: Self-pay

## 2022-05-18 ENCOUNTER — Telehealth: Payer: Self-pay

## 2022-05-18 NOTE — Telephone Encounter (Signed)
Called patient for Epclusa counseling. Patient did not answer, so will try again next week.  Jacolyn Reedy, Student Pharm-D [facility] for Infectious Disease

## 2022-05-18 NOTE — Telephone Encounter (Signed)
Called patient to clarify and counseled on Epclusa treatment. He was previously prescribed Mavyret but insurance required [place]. Thanks Aundra Millet!

## 2022-05-18 NOTE — Telephone Encounter (Signed)
Patient is approved to receive Epclusa x 12 weeks for chronic Hepatitis C infection. Counseled patient to take Epclusa daily with or without food. Encouraged patient not to miss any doses and explained how their chance of cure could go down with each dose missed. Counseled patient on what to do if dose is missed - if it is closer to the missed dose take immediately; if closer to next dose skip dose and take the next dose at the usual time.   Counseled patient on common side effects such as headache, fatigue, and nausea and that these normally decrease with time. I reviewed patient medications and found no drug interactions. Discussed with patient that there are several drug interactions including acid suppressants. Instructed patient to call clinic if he wishes to start a new medication during course of therapy. Also advised patient to call if he experiences any side effects. Patient will follow-up with me in the pharmacy clinic on 06/14/22.  Khai Torbert L. Faith Patricelli, PharmD, BCIDP, AAHIVP, CPP Clinical Pharmacist Practitioner Infectious Diseases Clinical Pharmacist [facility] 05/18/2022, 3:22 PM

## 2022-05-18 NOTE — Telephone Encounter (Signed)
Received call from patient requesting clarification in regards to Epclusa. He was told by Dr. Orvan Falconer to take three times daily, but prescription that was sent in states once daily. Routing to pharmacy for advise.   Sandie Ano, RN

## 2022-05-24 ENCOUNTER — Telehealth: Payer: Self-pay

## 2022-05-24 NOTE — Telephone Encounter (Signed)
[facility] Patient Advocate Encounter  Henri Medal start date 05/19/22 [employer].  Clearance Coots, CPhT Specialty Pharmacy Patient [facility] for Infectious Disease Phone: [phone] Fax:  [phone]

## 2022-06-14 ENCOUNTER — Ambulatory Visit: Payer: 59 | Admitting: Pharmacist

## 2022-06-23 ENCOUNTER — Ambulatory Visit (INDEPENDENT_AMBULATORY_CARE_PROVIDER_SITE_OTHER): Payer: 59 | Admitting: Pharmacist

## 2022-06-23 ENCOUNTER — Other Ambulatory Visit: Payer: Self-pay

## 2022-06-23 DIAGNOSIS — B182 Chronic viral hepatitis C: Secondary | ICD-10-CM

## 2022-06-23 DIAGNOSIS — Z23 Encounter for immunization: Secondary | ICD-10-CM

## 2022-06-23 NOTE — Progress Notes (Signed)
06/23/2022  HPI: Aaron Leonard is a 66 y.o. male who presents to the [facility] pharmacy clinic for Hepatitis C follow-up.  Medication: Epclusa x 12 weeks  Start Date: 05/19/22  Hepatitis C Genotype: 1a  Fibrosis Score: F4  Hepatitis C RNA: 1.1 million on 01/05/22  Patient Active Problem List   Diagnosis Date Noted   Abdominal mass 02/16/2022   Chronic hepatitis C without hepatic coma (HCC) 01/18/2022   Hypertension 01/18/2022   Liver abscess 08/09/2013    Patient's Medications  New Prescriptions   No medications on file  Previous Medications   EPCLUSA 400-100 MG TABS    Take 1 tablet by mouth daily.   IBUPROFEN (ADVIL) 200 MG TABLET    Take 200 mg by mouth every 6 (six) hours as needed.   LOSARTAN-HYDROCHLOROTHIAZIDE (HYZAAR) 50-12.5 MG TABLET    TAKE 1 TABLET BY MOUTH EVERY DAY   NAPROXEN SODIUM (ALEVE) 220 MG TABLET    Take 220 mg by mouth.  Modified Medications   No medications on file  Discontinued Medications   No medications on file    Allergies: No Known Allergies  Past Medical History: Past Medical History:  Diagnosis Date   Allergy    RHINITIS   Congenital anomalies of the testes    ED (erectile dysfunction)    Medical history non-contributory     Social History: Social History   Socioeconomic History   Marital status: Married    Spouse name: Not on file   Number of children: Not on file   Years of education: Not on file   Highest education level: Not on file  Occupational History   Not on file  Tobacco Use   Smoking status: Never   Smokeless tobacco: Never  Substance and Sexual Activity   Alcohol use: No   Drug use: No   Sexual activity: Yes  Other Topics Concern   Not on file  Social History Narrative   Not on file   Social Determinants of Health   Financial Resource Strain: Not on file  Food Insecurity: Not on file  Transportation Needs: Not on file  Physical Activity: Not on file  Stress: Not on file  Social Connections: Not  on file    Labs: Hepatitis C Lab Results  Component Value Date   HCVGENOTYPE Comment 01/05/2022   FIBROSTAGE F4 01/19/2022   Hepatitis B Lab Results  Component Value Date   HEPBSAG NON-REACTIVE 01/19/2022   Hepatitis A No results found for: "HAV" HIV Lab Results  Component Value Date   HIV NON-REACTIVE 01/19/2022   Lab Results  Component Value Date   CREATININE 0.93 12/31/2021   CREATININE 0.63 08/28/2013   CREATININE 0.78 08/15/2013   CREATININE 0.62 08/11/2013   CREATININE 0.67 08/10/2013   Lab Results  Component Value Date   AST 143 (H) 12/31/2021   AST 75 (H) 08/28/2013   AST 121 (H) 08/15/2013   ALT 126 (H) 01/19/2022   ALT 163 (H) 12/31/2021   ALT 59 (H) 08/28/2013   INR 1.08 08/10/2013   INR 1.07 08/09/2013   INR 1.06 08/09/2013    Assessment: Aaron Leonard is here today for his 4 week Hep C follow up. He started 12 weeks of Epclusa back at the end of November. He is tolerating it very well with no side effects. He has not missed a single dose and started his 2nd month last week. [facility] issues getting it from [facility]. No changes in his insurance in 2024. He does  not have any immunity to Hepatitis B so will start that vaccine series for him today. Congratulated him on the great job so far, reminded him that it is 12 weeks total, and asked him to keep up the good work. He is very motivated to not miss any doses. Will check labs today and have him see Dr. Orvan Falconer when he has completed treatment.  Plan: - Continue Epclusa for 12 weeks total - Heplisav B #1 today - Hep C RNA and CMP - F/u with Dr. Orvan Falconer on 08/30/22  Michayla Mcneil L. Posey Jasmin, PharmD, BCIDP, AAHIVP, CPP Clinical Pharmacist Practitioner Infectious Diseases Clinical Pharmacist [facility] 06/23/2022, 9:46 AM

## 2022-06-23 NOTE — Addendum Note (Signed)
Addended by: Robinette Haines on: 06/23/2022 02:23 PM   Modules accepted: Orders

## 2022-06-27 LAB — COMPREHENSIVE METABOLIC PANEL
AG Ratio: 1.2 (calc) (ref 1.0–2.5)
ALT: 48 U/L — ABNORMAL HIGH (ref 9–46)
AST: 55 U/L — ABNORMAL HIGH (ref 10–35)
Albumin: 4.3 g/dL (ref 3.6–5.1)
Alkaline phosphatase (APISO): 64 U/L (ref 35–144)
BUN: 19 mg/dL (ref 7–25)
CO2: 29 mmol/L (ref 20–32)
Calcium: 10 mg/dL (ref 8.6–10.3)
Chloride: 103 mmol/L (ref 98–110)
Creat: 0.97 mg/dL (ref 0.70–1.35)
Globulin: 3.7 g/dL (calc) (ref 1.9–3.7)
Glucose, Bld: 109 mg/dL — ABNORMAL HIGH (ref 65–99)
Potassium: 4.3 mmol/L (ref 3.5–5.3)
Sodium: 140 mmol/L (ref 135–146)
Total Bilirubin: 0.7 mg/dL (ref 0.2–1.2)
Total Protein: 8 g/dL (ref 6.1–8.1)

## 2022-06-27 LAB — HEPATITIS C RNA QUANTITATIVE
HCV Quantitative Log: <1.18 log IU/mL
HCV RNA, PCR, QN: <15 IU/mL

## 2022-07-05 ENCOUNTER — Encounter: Payer: 59 | Admitting: Family Medicine

## 2022-07-07 ENCOUNTER — Encounter: Payer: Self-pay | Admitting: Family Medicine

## 2022-07-18 ENCOUNTER — Telehealth: Payer: Self-pay

## 2022-07-18 NOTE — Telephone Encounter (Signed)
Patient concerned about itchy rash on forearms x 2 days. Concerned he may be having a reaction to [place]. Patient hasn't started any other new medications. Patient is currently on his 3rd month of medication. Patient denies any other symptoms. I advised I would send a message back to our pharmacy staff. Call back number is [phone].     [place], CMA

## 2022-07-29 ENCOUNTER — Other Ambulatory Visit: Payer: Self-pay | Admitting: Family Medicine

## 2022-08-10 ENCOUNTER — Ambulatory Visit (INDEPENDENT_AMBULATORY_CARE_PROVIDER_SITE_OTHER): Payer: No Typology Code available for payment source | Admitting: Family Medicine

## 2022-08-10 ENCOUNTER — Encounter: Payer: Self-pay | Admitting: Family Medicine

## 2022-08-10 VITALS — BP 146/102 | HR 79 | Temp 97.9°F | Resp 18 | Ht 67.5 in | Wt 184.0 lb

## 2022-08-10 DIAGNOSIS — L819 Disorder of pigmentation, unspecified: Secondary | ICD-10-CM

## 2022-08-10 DIAGNOSIS — Z6379 Other stressful life events affecting family and household: Secondary | ICD-10-CM

## 2022-08-10 DIAGNOSIS — B182 Chronic viral hepatitis C: Secondary | ICD-10-CM | POA: Diagnosis not present

## 2022-08-10 DIAGNOSIS — Z0000 Encounter for general adult medical examination without abnormal findings: Secondary | ICD-10-CM | POA: Diagnosis not present

## 2022-08-10 DIAGNOSIS — Z23 Encounter for immunization: Secondary | ICD-10-CM

## 2022-08-10 DIAGNOSIS — I10 Essential (primary) hypertension: Secondary | ICD-10-CM

## 2022-08-10 DIAGNOSIS — R1900 Intra-abdominal and pelvic swelling, mass and lump, unspecified site: Secondary | ICD-10-CM

## 2022-08-10 DIAGNOSIS — Z1211 Encounter for screening for malignant neoplasm of colon: Secondary | ICD-10-CM

## 2022-08-10 MED ORDER — LOSARTAN POTASSIUM-HCTZ 50-12.5 MG PO TABS: 1.0000 | ORAL_TABLET | Freq: Every day | ORAL | 3 refills | Status: DC

## 2022-08-10 NOTE — Progress Notes (Signed)
Complete physical exam  Patient: Aaron Leonard   DOB: 1956-06-06   67 y.o. Male  MRN: [id number]  Subjective:    Chief Complaint  Patient presents with   Annual Exam    Annual physical. Fasting. No additional concerns.     Aaron Leonard is a 67 y.o. male who presents today for a complete physical exam. He reports consuming a general diet. The patient does not participate in regular exercise at present. He generally feels fairly well. He reports sleeping fairly well. He does note some pigment changes on his cheeks that he has concerns about.  He continues on losartan/HCTZ and having no difficulty with that.  He is followed in infectious disease by Dr. Megan Salon and presently is on [place].  He seems be doing fairly well on this.  His other concern is his wife who has apparently had 2 strokes and is now in recovery.  This has him stressed but he seems to be handling this well.  He is on an FMLA for this.  His son is also helping.  He did have a colonoscopy in 2012.  No polyps were seen and he has a negative history for colon cancer in his family. Most recent fall risk assessment:    08/10/2022    9:04 AM  [place] in the past year? 0  Number falls in past yr: 0  Injury with Fall? 0  Risk for fall due to : No Fall Risks  Follow up Falls evaluation completed     Most recent depression screenings:    08/10/2022    9:04 AM 03/30/2022    8:59 AM  PHQ 2/9 Scores  PHQ - 2 Score 0 0    Vision:Not within last year  and Dental: No regular dental care     Patient Care Team: Denita Lung, MD as PCP - General (Family Medicine)   Outpatient Medications Prior to Visit  Medication Sig   EPCLUSA 400-100 MG TABS Take 1 tablet by mouth daily.   ibuprofen (ADVIL) 200 MG tablet Take 200 mg by mouth every 6 (six) hours as needed.   naproxen sodium (ALEVE) 220 MG tablet Take 220 mg by mouth.   [DISCONTINUED] losartan-hydrochlorothiazide (HYZAAR) 50-12.5 MG tablet TAKE 1 TABLET BY  MOUTH EVERY DAY   No facility-administered medications prior to visit.    Review of Systems  All other systems reviewed and are negative.         Objective:     BP (!) 146/102 (BP Location: Left Arm, Cuff Size: Large)   Pulse 79   Temp 97.9 F (36.6 C) (Oral)   Resp 18   Ht 5' 7.5" (1.715 m)   Wt 184 lb (83.5 kg)   SpO2 97% Comment: room air  BMI 28.39 kg/m    Physical Exam   Alert and in no distress. Tympanic membranes and canals are normal. Pharyngeal area is normal. Neck is supple without adenopathy or thyromegaly. Cardiac exam shows a regular sinus rhythm without murmurs or gallops. Lungs are clear to auscultation.  Exam of his cheeks does show some pigmented stippled appearing lesions.     Assessment & Plan:   Routine general medical examination at a health care facility  Hypertension, unspecified type - Plan: losartan-hydrochlorothiazide (HYZAAR) 50-12.5 MG tablet  Chronic hepatitis C without hepatic coma (HCC)  Hyperpigmentation - Plan: Ambulatory referral to Dermatology  Need for COVID-19 vaccine - Plan: Pfizer Fall 2023 Covid-19 Vaccine 65yr and older  Stress due to illness of family member  Screening for colon cancer - Plan: Cologuard  Abdominal mass, unspecified abdominal location He will continue to be followed by infectious disease.  Continue on his blood pressure medication.  Immunization History  Administered Date(s) Administered   COVID-19, mRNA, vaccine(Comirnaty)12 years and older 08/10/2022   Hepb-cpg 06/23/2022   Influenza Split 06/07/2011   Influenza,inj,Quad PF,6+ Mos 04/10/2022   PFIZER(Purple Top)SARS-COV-2 Vaccination 09/26/2019, 10/21/2019   PNEUMOCOCCAL CONJUGATE-20 12/31/2021   Tdap 06/24/2008, 12/31/2021   Zoster Recombinat (Shingrix) 01/31/2022, 04/04/2022    Health Maintenance  Topic Date Due   COLONOSCOPY (Pts 45-62yr Insurance coverage will need to be confirmed)  06/05/2021   DTaP/Tdap/Td (3 - Td or Tdap) 01/01/2032    Pneumonia Vaccine 68 Years old  Completed   INFLUENZA VACCINE  Completed   COVID-19 Vaccine  Completed   Hepatitis C Screening  Completed   Zoster Vaccines- Shingrix  Completed   HPV VACCINES  Aged Out    Discussed the skin changes and will refer to dermatology to get some help with that.  I then discussed the family stress that he is under helping take care of his wife.  He seems to have a good handle on this.  Discussed the possibility of getting counseling on this if it became too much for him to handle.  Continue to be followed in infectious disease. Problem List Items Addressed This Visit     Abdominal mass   Chronic hepatitis C without hepatic coma (HCC)   Hypertension   Relevant Medications   losartan-hydrochlorothiazide (HYZAAR) 50-12.5 MG tablet   Other Visit Diagnoses     Routine general medical examination at a health care facility    -  Primary   Hyperpigmentation       Relevant Orders   Ambulatory referral to Dermatology   Need for COVID-19 vaccine       Relevant Orders   Pfizer Fall 2023 Covid-19 Vaccine 167yrand older (Completed)   Stress due to illness of family member       Screening for colon cancer       Relevant Orders   Cologuard      Follow-up 1 year    JoJill AlexandersMD

## 2022-08-31 ENCOUNTER — Ambulatory Visit (INDEPENDENT_AMBULATORY_CARE_PROVIDER_SITE_OTHER): Payer: No Typology Code available for payment source | Admitting: Internal Medicine

## 2022-08-31 ENCOUNTER — Encounter: Payer: Self-pay | Admitting: Internal Medicine

## 2022-08-31 ENCOUNTER — Other Ambulatory Visit: Payer: Self-pay

## 2022-08-31 VITALS — BP 144/97 | HR 72 | Temp 97.5°F | Ht 69.0 in | Wt 192.0 lb

## 2022-08-31 DIAGNOSIS — B182 Chronic viral hepatitis C: Secondary | ICD-10-CM

## 2022-08-31 NOTE — Progress Notes (Signed)
        [facility]  Patient Active Problem List   Diagnosis Date Noted   Chronic hepatitis C without hepatic coma ([place]) 01/18/2022    Priority: High   Abdominal mass 02/16/2022   Hypertension 01/18/2022    Patient's Medications  New Prescriptions   No medications on file  Previous Medications   EPCLUSA 400-100 MG TABS    Take 1 tablet by mouth daily.   IBUPROFEN (ADVIL) 200 MG TABLET    Take 200 mg by mouth every 6 (six) hours as needed.   LOSARTAN-HYDROCHLOROTHIAZIDE (HYZAAR) 50-12.5 MG TABLET    Take 1 tablet by mouth daily.   NAPROXEN SODIUM (ALEVE) 220 MG TABLET    Take 220 mg by mouth.  Modified Medications   No medications on file  Discontinued Medications   No medications on file    Subjective: Aaron Leonard is in for his routine follow-up visit.  He has chronic hepatitis C with a baseline hepatitis C viral load of 1,140,000.  He has genotype Ia fibrosis score of F4.  He started [place] on 05/19/2022 and recently completed 3 months of therapy.  He had no problems tolerating Epclusa and did not miss any doses.  He is currently on FMLA and helping care for his wife who is recovering from 2 strokes recently.  He has completed his moved to [employer].  He is feeling a little more settled.  Review of Systems: Review of Systems  Constitutional:  Negative for fever and weight loss.  Gastrointestinal:  Negative for abdominal pain.    Past Medical History:  Diagnosis Date   Allergy    RHINITIS   Congenital anomalies of the testes    ED (erectile dysfunction)    Medical history non-contributory     Social History   Tobacco Use   Smoking status: Never   Smokeless tobacco: Never  Substance Use Topics   Alcohol use: No   Drug use: No    No family history on file.  No Known Allergies  Objective: Vitals:   08/31/22 0839  BP: (!) 143/95  Pulse: 75  Temp: (!) 97.5 F (36.4 C)  TempSrc: Temporal  SpO2: 98%  Weight: 192 lb (87.1 kg)   Height: '5\' 9"'$  (1.753 m)   Body mass index is 28.35 kg/m.  Physical Exam Constitutional:      Comments: He is very pleasant as usual.  Cardiovascular:     Rate and Rhythm: Normal rate.  Pulmonary:     Effort: Pulmonary effort is normal.  Psychiatric:        Mood and Affect: Mood normal.     Lab Results Hepatitis C viral load 06/23/2022 less than 15   Problem List Items Addressed This Visit       High   Chronic hepatitis C without hepatic coma ([place]) - Primary    He responded very promptly to Epclusa therapy and his viral load became undetectable within 4 weeks.  He is here today for end of therapy testing.  I expect that his hepatitis C has been cured.  He will follow-up in a couple of months for test of cure.      Relevant Orders   Hepatitis C RNA quantitative     Aaron Bickers, MD [facility] [place] [facility] [phone] pager   [phone] cell 08/31/2022, 8:54 AM

## 2022-08-31 NOTE — Assessment & Plan Note (Signed)
He responded very promptly to [facility] therapy and his viral load became undetectable within 4 weeks.  He is here today for end of therapy testing.  I expect that his hepatitis C has been cured.  He will follow-up in a couple of months for test of cure.

## 2022-09-02 LAB — HEPATITIS C RNA QUANTITATIVE
HCV Quantitative Log: <1.18 log IU/mL
HCV RNA, PCR, QN: <15 IU/mL

## 2022-09-21 ENCOUNTER — Encounter: Payer: Self-pay | Admitting: Gastroenterology

## 2022-09-21 ENCOUNTER — Telehealth: Payer: Self-pay | Admitting: Internal Medicine

## 2022-09-21 NOTE — Telephone Encounter (Signed)
Need some clarification please.   Pt called and states he needs a PA for another cologuard. He had on done last year that was positive and pt was referred to GI but never called them back to schedule. At his Annual visit this year, you put in another referral for Cologuard and put in [place] notes no issues with colonoscopy back in 2012. Since patient had a positive cologuard last year, he will not be able to have another cologuard. He has to go through GI correct?

## 2022-09-21 NOTE — Telephone Encounter (Signed)
Pt was notified and will contact GI to schedule

## 2022-09-21 NOTE — Telephone Encounter (Signed)
Tried to call pt but phone rang and rang.    He can call GI to schedule. [phone]

## 2022-10-12 ENCOUNTER — Ambulatory Visit (AMBULATORY_SURGERY_CENTER): Payer: No Typology Code available for payment source | Admitting: *Deleted

## 2022-10-12 ENCOUNTER — Other Ambulatory Visit: Payer: Self-pay

## 2022-10-12 ENCOUNTER — Ambulatory Visit (INDEPENDENT_AMBULATORY_CARE_PROVIDER_SITE_OTHER): Payer: No Typology Code available for payment source | Admitting: Internal Medicine

## 2022-10-12 ENCOUNTER — Encounter: Payer: Self-pay | Admitting: Internal Medicine

## 2022-10-12 VITALS — BP 140/100 | HR 67 | Temp 97.5°F | Ht 69.0 in | Wt 193.0 lb

## 2022-10-12 VITALS — Ht 69.0 in | Wt 193.0 lb

## 2022-10-12 DIAGNOSIS — B182 Chronic viral hepatitis C: Secondary | ICD-10-CM | POA: Diagnosis not present

## 2022-10-12 DIAGNOSIS — R195 Other fecal abnormalities: Secondary | ICD-10-CM

## 2022-10-12 MED ORDER — NA SULFATE-K SULFATE-MG SULF 17.5-3.13-1.6 GM/177ML PO SOLN
1.0000 | Freq: Once | ORAL | 0 refills | Status: AC
Start: 2022-10-12 — End: 2022-10-12

## 2022-10-12 NOTE — Progress Notes (Signed)
        [facility]  Patient Active Problem List   Diagnosis Date Noted   Chronic hepatitis C without hepatic coma 01/18/2022    Priority: High   Abdominal mass 02/16/2022   Hypertension 01/18/2022    Patient's Medications  New Prescriptions   No medications on file  Previous Medications   EPCLUSA 400-100 MG TABS    Take 1 tablet by mouth daily.   IBUPROFEN (ADVIL) 200 MG TABLET    Take 200 mg by mouth every 6 (six) hours as needed.   LOSARTAN-HYDROCHLOROTHIAZIDE (HYZAAR) 50-12.5 MG TABLET    Take 1 tablet by mouth daily.   NAPROXEN SODIUM (ALEVE) 220 MG TABLET    Take 220 mg by mouth.  Modified Medications   No medications on file  Discontinued Medications   No medications on file    Subjective: Aaron Leonard is in for his routine follow-up visit.  He has chronic hepatitis C with a baseline hepatitis C viral load of 1,140,000.  He has genotype Ia fibrosis score of F4.  He started [place] on 05/19/2022 and completed 3 months of therapy early this year.  He had no problems tolerating Epclusa and did not miss any doses.  He is currently on FMLA and helping care for his wife who is recovering from 2 strokes recently.  She is also dealing with anemia and thrombocytosis.  He has completed his moved to [employer].  He is feeling a little more settled.  Review of Systems: Review of Systems  Constitutional:  Negative for fever and weight loss.  Gastrointestinal:  Negative for abdominal pain.    Past Medical History:  Diagnosis Date   Allergy    RHINITIS   Congenital anomalies of the testes    ED (erectile dysfunction)    Medical history non-contributory     Social History   Tobacco Use   Smoking status: Never   Smokeless tobacco: Never  Substance Use Topics   Alcohol use: No   Drug use: No    No family history on file.  No Known Allergies  Objective: Vitals:   10/12/22 0838  BP: (!) 148/105  Pulse: 70  Temp: (!) 97.5 F (36.4 C)  TempSrc:  Temporal  SpO2: 97%  Weight: 193 lb (87.5 kg)  Height:  (1.753 m)   Body mass index is 28.5 kg/m.  Physical Exam Constitutional:      Comments: He is very pleasant as usual.  Cardiovascular:     Rate and Rhythm: Normal rate.  Pulmonary:     Effort: Pulmonary effort is normal.  Psychiatric:        Mood and Affect: Mood normal.     Lab Results Hepatitis C viral load 06/23/2022 less than 15 Hepatitis C viral load 08/31/2022 less than 15   Problem List Items Addressed This Visit       High   Chronic hepatitis C without hepatic coma - Primary    I strongly suspect that his hepatitis C has been cured.  I will repeat his viral load today.      Relevant Orders   Hepatitis C RNA quantitative     Aaron Asters, MD [facility] for [facility]ious Disease [facility] [phone] pager   [phone] cell 10/12/2022, 8:51 AM

## 2022-10-12 NOTE — Assessment & Plan Note (Signed)
I strongly suspect that his hepatitis C has been cured.  I will repeat his viral load today.

## 2022-10-12 NOTE — Progress Notes (Signed)

## 2022-10-14 LAB — HEPATITIS C RNA QUANTITATIVE
HCV Quantitative Log: <1.18 log IU/mL
HCV RNA, PCR, QN: <15 IU/mL

## 2022-10-21 ENCOUNTER — Encounter: Payer: No Typology Code available for payment source | Admitting: Gastroenterology

## 2022-11-08 ENCOUNTER — Encounter: Payer: No Typology Code available for payment source | Admitting: Gastroenterology

## 2022-11-11 ENCOUNTER — Encounter: Payer: No Typology Code available for payment source | Admitting: Gastroenterology

## 2022-11-30 ENCOUNTER — Encounter: Payer: Self-pay | Admitting: Gastroenterology

## 2022-12-13 ENCOUNTER — Encounter: Payer: No Typology Code available for payment source | Admitting: Gastroenterology

## 2023-02-13 ENCOUNTER — Telehealth: Payer: Self-pay | Admitting: Gastroenterology

## 2023-02-13 NOTE — Telephone Encounter (Signed)
Patient called states He had a few things he was not suppose to with having his procedure coming up on 02/16/23, requested to speak with a nurse to see if he can still proceed with the procedure.

## 2023-02-13 NOTE — Telephone Encounter (Signed)
Called and spoke with patient- patient reports he ate a salad and some peanuts on Saturday- patient advised of new dates/times that were not originally advised to him when he rescheduled his procedure; also advised the patient to increase oral fluids, activity, and to take a gentle laxative if he has not had a bowel movement by tomorrow am- patient reports he normally has bowel movement daily; Patient advised to call back to the office at [phone] should questions/concerns arise; Patient verbalized understanding of information/instructions;

## 2023-02-16 ENCOUNTER — Encounter: Payer: Self-pay | Admitting: Gastroenterology

## 2023-02-16 ENCOUNTER — Ambulatory Visit: Payer: No Typology Code available for payment source | Admitting: Gastroenterology

## 2023-02-16 VITALS — BP 99/55 | HR 86 | Temp 99.1°F | Resp 12 | Ht 69.0 in | Wt 193.0 lb

## 2023-02-16 DIAGNOSIS — D122 Benign neoplasm of ascending colon: Secondary | ICD-10-CM | POA: Diagnosis not present

## 2023-02-16 DIAGNOSIS — R195 Other fecal abnormalities: Secondary | ICD-10-CM

## 2023-02-16 DIAGNOSIS — D123 Benign neoplasm of transverse colon: Secondary | ICD-10-CM | POA: Diagnosis not present

## 2023-02-16 DIAGNOSIS — Z1211 Encounter for screening for malignant neoplasm of colon: Secondary | ICD-10-CM

## 2023-02-16 MED ORDER — SODIUM CHLORIDE 0.9 % IV SOLN
500.0000 mL | Freq: Once | INTRAVENOUS | Status: AC
Start: 2023-02-16 — End: ?

## 2023-02-16 NOTE — Progress Notes (Signed)
Sedate, gd SR, tolerated procedure well, VSS, report to RN 

## 2023-02-16 NOTE — Progress Notes (Signed)
Called to room to assist during endoscopic procedure.  Patient ID and intended procedure confirmed with present staff. Received instructions for my participation in the procedure from the performing physician.  

## 2023-02-16 NOTE — Patient Instructions (Signed)
YOU HAD AN ENDOSCOPIC PROCEDURE TODAY AT THE [facility]:   Refer to the procedure report that was given to you for any specific questions about what was found during the examination.  If the procedure report does not answer your questions, please call your gastroenterologist to clarify.  If you requested that your care partner not be given the details of your procedure findings, then the procedure report has been included in a sealed envelope for you to review at your convenience later.  YOU SHOULD EXPECT: Some feelings of bloating in the abdomen. Passage of more gas than usual.  Walking can help get rid of the air that was put into your GI tract during the procedure and reduce the bloating. If you had a lower endoscopy (such as a colonoscopy or flexible sigmoidoscopy) you may notice spotting of blood in your stool or on the toilet paper. If you underwent a bowel prep for your procedure, you may not have a normal bowel movement for a few days.  Please Note:  You might notice some irritation and congestion in your nose or some drainage.  This is from the oxygen used during your procedure.  There is no need for concern and it should clear up in a day or so.  SYMPTOMS TO REPORT IMMEDIATELY:  Following lower endoscopy (colonoscopy or flexible sigmoidoscopy):  Excessive amounts of blood in the stool  Significant tenderness or worsening of abdominal pains  Swelling of the abdomen that is new, acute  Fever of 100F or higher   For urgent or emergent issues, a gastroenterologist can be reached at any hour by calling [phone]. Do not use MyChart messaging for urgent concerns.    DIET:  We do recommend a small meal at first, but then you may proceed to your regular diet.  Drink plenty of fluids but you should avoid alcoholic beverages for 24 hours.  MEDICATIONS: Continue present medications.  FOLLOW UP: Await pathology results. Repeat colonoscopy in 3 years for surveillance based on  pathology results.  Please see handouts given to you by your recovery nurse: Polyps.  Thank you for allowing [place] to provide for your healthcare needs today.  ACTIVITY:  You should plan to take it easy for the rest of today and you should NOT DRIVE or use heavy machinery until tomorrow (because of the sedation medicines used during the test).    FOLLOW UP: Our staff will call the number listed on your records the next business day following your procedure.  We will call around 7:15- 8:00 am to check on you and address any questions or concerns that you may have regarding the information given to you following your procedure. If we do not reach you, we will leave a message.     If any biopsies were taken you will be contacted by phone or by letter within the next 1-3 weeks.  Please call us at [phone] if you have not heard about the biopsies in 3 weeks.    SIGNATURES/CONFIDENTIALITY: You and/or your care partner have signed paperwork which will be entered into your electronic medical record.  These signatures attest to the fact that that the information above on your After Visit Summary has been reviewed and is understood.  Full responsibility of the confidentiality of this discharge information lies with you and/or your care-partner.

## 2023-02-16 NOTE — Op Note (Signed)
[facility] Patient Name: Aaron Leonard Procedure Date: 02/16/2023 2:14 PM MRN: [id number] Endoscopist: Napoleon Form , MD, [id number] Age: 67 Referring MD:  Date of Birth: 12/20/1955 Gender: Male Account #: [id number] Procedure:                Colonoscopy Indications:              Positive Cologuard test Medicines:                Monitored Anesthesia Care Procedure:                Pre-Anesthesia Assessment:                           - Prior to the procedure, a History and Physical                            was performed, and patient medications and                            allergies were reviewed. The patient's tolerance of                            previous anesthesia was also reviewed. The risks                            and benefits of the procedure and the sedation                            options and risks were discussed with the patient.                            All questions were answered, and informed consent                            was obtained. Prior Anticoagulants: The patient has                            taken no anticoagulant or antiplatelet agents. ASA                            Grade Assessment: II - A patient with mild systemic                            disease. After reviewing the risks and benefits,                            the patient was deemed in satisfactory condition to                            undergo the procedure.                           After obtaining informed consent, the colonoscope  was passed under direct vision. Throughout the                            procedure, the patient's blood pressure, pulse, and                            oxygen saturations were monitored continuously. The                            PCF-HQ190L Colonoscope [id number] was introduced                            through the anus and advanced to the the cecum,                            identified by appendiceal orifice and  ileocecal                            valve. The colonoscopy was performed without                            difficulty. The patient tolerated the procedure                            well. The quality of the bowel preparation was                            good. The ileocecal valve, appendiceal orifice, and                            rectum were photographed. Scope In: 2:22:01 PM Scope Out: 2:44:33 PM Scope Withdrawal Time: 0 hours 17 minutes 26 seconds  Total Procedure Duration: 0 hours 22 minutes 32 seconds  Findings:                 The perianal and digital rectal examinations were                            normal.                           An 18 mm polyp was found in the ascending colon.                            The polyp was pedunculated. The polyp was removed                            with a hot snare. Resection and retrieval were                            complete.                           Three sessile polyps were found in the transverse  colon. The polyps were 4 to 7 mm in size. These                            polyps were removed with a cold snare. Resection                            was complete, but the polyp tissue was only                            partially retrieved, 2 out of 3 polyps. Complications:            No immediate complications. Estimated Blood Loss:     Estimated blood loss was minimal. Impression:               - One 18 mm polyp in the ascending colon, removed                            with a hot snare. Resected and retrieved.                           - Three 4 to 7 mm polyps in the transverse colon,                            removed with a cold snare. Complete resection.                            Partial retrieval, 2 out of 3 polyps. Recommendation:           - Patient has a contact number available for                            emergencies. The signs and symptoms of potential                            delayed  complications were discussed with the                            patient. Return to normal activities tomorrow.                            Written discharge instructions were provided to the                            patient.                           - Resume previous diet.                           - Continue present medications.                           - Await pathology results.                           -  Repeat colonoscopy in 3 years for surveillance                            based on pathology results.                           - No aspirin, ibuprofen, naproxen, or other                            non-steroidal anti-inflammatory drugs for 2 weeks. Napoleon Form, MD 02/16/2023 2:50:38 PM This report has been signed electronically.

## 2023-02-16 NOTE — Progress Notes (Signed)
[facility] History and Physical   Primary Care Physician:  Ronnald Nian, MD   Reason for Procedure:  Positive Cologuard  Plan:     colonoscopy with possible interventions as needed     HPI: Aaron Leonard is a very pleasant 67 y.o. male here for colonoscopy for positive Cologuard   The risks and benefits as well as alternatives of endoscopic procedure(s) have been discussed and reviewed. All questions answered. The patient agrees to proceed.    Past Medical History:  Diagnosis Date   Allergy    RHINITIS   Congenital anomalies of the testes    ED (erectile dysfunction)    Hypertension    Medical history non-contributory    enlarged liver    Past Surgical History:  Procedure Laterality Date   PICC LINE INSERTION     TONSILLECTOMY      Prior to Admission medications   Medication Sig Start Date End Date Taking? Authorizing Provider  losartan-hydrochlorothiazide (HYZAAR) 50-12.5 MG tablet Take 1 tablet by mouth daily. 08/10/22  Yes Ronnald Nian, MD  ibuprofen (ADVIL) 200 MG tablet Take 200 mg by mouth every 6 (six) hours as needed. Patient not taking: Reported on 08/31/2022    [provider]  naproxen sodium (ALEVE) 220 MG tablet Take 220 mg by mouth. Patient not taking: Reported on 08/31/2022    [provider]    Current Outpatient Medications  Medication Sig Dispense Refill   losartan-hydrochlorothiazide (HYZAAR) 50-12.5 MG tablet Take 1 tablet by mouth daily. 90 tablet 3   ibuprofen (ADVIL) 200 MG tablet Take 200 mg by mouth every 6 (six) hours as needed. (Patient not taking: Reported on 08/31/2022)     naproxen sodium (ALEVE) 220 MG tablet Take 220 mg by mouth. (Patient not taking: Reported on 08/31/2022)     Current Facility-Administered Medications  Medication Dose Route Frequency Provider Last Rate Last Admin   0.9 %  sodium chloride infusion  500 mL Intravenous Once Napoleon Form, MD        Allergies as of 02/16/2023    (No Known Allergies)    Family History  Problem Relation Age of Onset   Colon cancer Neg Hx    Colon polyps Neg Hx    Crohn's disease Neg Hx    Esophageal cancer Neg Hx    Rectal cancer Neg Hx    Stomach cancer Neg Hx    Ulcerative colitis Neg Hx     Social History   Socioeconomic History   Marital status: Married    Spouse name: Not on file   Number of children: Not on file   Years of education: Not on file   Highest education level: Not on file  Occupational History   Not on file  Tobacco Use   Smoking status: Never   Smokeless tobacco: Never  Vaping Use   Vaping status: Never Used  Substance and Sexual Activity   Alcohol use: No   Drug use: No   Sexual activity: Yes  Other Topics Concern   Not on file  Social History Narrative   Not on file   Social Determinants of Health   Financial Resource Strain: Not on file  Food Insecurity: Not on file  Transportation Needs: Not on file  Physical Activity: Not on file  Stress: Not on file  Social Connections: Not on file  Intimate Partner Violence: Not on file    Review of Systems:  All other review of systems negative except as  mentioned in the HPI.  Physical Exam: Vital signs in last 24 hours: BP 128/83   Pulse 89   Temp 99.1 F (37.3 C)   Ht 5\' 9"  (1.753 m)   Wt 193 lb (87.5 kg)   SpO2 97%   BMI 28.50 kg/m  General:   Alert, NAD Lungs:  Clear .   Heart:  Regular rate and rhythm Abdomen:  Soft, nontender and nondistended. Neuro/Psych:  Alert and cooperative. Normal mood and affect. A and O x 3  Reviewed labs, radiology imaging, old records and pertinent past GI work up  Patient is appropriate for planned procedure(s) and anesthesia in an ambulatory setting   K. Scherry Ran , MD [phone]

## 2023-02-17 ENCOUNTER — Telehealth: Payer: Self-pay

## 2023-02-17 NOTE — Telephone Encounter (Signed)
  Follow up Call-     02/16/2023    1:51 PM  Call back number  Post procedure Call Back phone  # [phone]  Permission to leave phone message Yes     Patient questions:  Do you have a fever, pain , or abdominal swelling? No. Pain Score  0 *  Have you tolerated food without any problems? Yes.    Have you been able to return to your normal activities? Yes.    Do you have any questions about your discharge instructions: Diet   No. Medications  No. Follow up visit  No.  Do you have questions or concerns about your Care? No.  Actions: * If pain score is 4 or above: No action needed, pain <4.

## 2023-03-03 ENCOUNTER — Encounter: Payer: Self-pay | Admitting: Gastroenterology

## 2023-03-03 LAB — HM COLONOSCOPY

## 2023-03-06 ENCOUNTER — Encounter: Payer: Self-pay | Admitting: Family Medicine

## 2023-10-03 ENCOUNTER — Ambulatory Visit: Admitting: Family Medicine

## 2023-10-03 VITALS — BP 124/80 | HR 94 | Wt 187.2 lb

## 2023-10-03 DIAGNOSIS — M25561 Pain in right knee: Secondary | ICD-10-CM | POA: Diagnosis not present

## 2023-10-03 NOTE — Progress Notes (Signed)
   Subjective:    Patient ID: Aaron Leonard, male    DOB: 1956/03/08, 68 y.o.   MRN: [id number]  HPI He complains of a several month history of right knee pain that tends to come and go.  He cannot associate this with any particular activity.  There has been no popping or locking.  He does state that sometimes it feels like it gives way.  It has not interfered with his ADLs.  No previous injury or overuse.   Review of Systems     Objective:    Physical Exam Right knee exam shows no effusion.  No joint line tenderness.  Patellar testing was normal.  Medial and lateral collateral ligaments intact.  McMurray's testing was slightly uncomfortable medially.       Assessment & Plan:  Right knee pain, unspecified chronicity - Plan: DG Knee Complete 4 Views Right I explained that I would expect to see some arthritis in there and this could indicate some degenerative changes in his knee and possibly also cartilage since he does have a questionably positive McMurray's.  If the x-ray shows only minor arthritic changes, I will have him return here for injection.

## 2023-10-04 ENCOUNTER — Ambulatory Visit
Admission: RE | Admit: 2023-10-04 | Discharge: 2023-10-04 | Disposition: A | Discharge status: Home / Self Care | Source: Ambulatory Visit | Attending: Family Medicine | Admitting: Family Medicine

## 2023-10-04 ENCOUNTER — Other Ambulatory Visit (HOSPITAL_COMMUNITY): Payer: Self-pay

## 2023-10-25 ENCOUNTER — Encounter: Payer: Self-pay | Admitting: Family Medicine

## 2023-10-25 ENCOUNTER — Ambulatory Visit: Admitting: Family Medicine

## 2023-10-25 VITALS — BP 120/80 | HR 87 | Wt 186.0 lb

## 2023-10-25 DIAGNOSIS — M25561 Pain in right knee: Secondary | ICD-10-CM

## 2023-10-25 NOTE — Progress Notes (Signed)
   Subjective:    Patient ID: Aaron Leonard, male    DOB: [birth year], 68 y.o.   MRN: [id number]  HPI He is here for a recheck.  Since he removed his knee brace, he has had much less difficulty with his knee and the swelling is dissipated.  Recent x-rays showed no major issues. Review of Systems     Objective:    Physical Exam Exam of his right knee shows no pain or effusion.  Ligaments intact.       Assessment & Plan:  Right knee pain, unspecified chronicity Since his knee is now returned to normal and the x-rays were negative, no further intervention was necessary.  Discussed the need for physical therapy at some point down the road if he starts having difficulty again.

## 2024-01-23 ENCOUNTER — Encounter: Admitting: Family Medicine

## 2024-02-02 ENCOUNTER — Encounter: Admitting: Family Medicine

## 2024-02-12 ENCOUNTER — Encounter: Payer: Self-pay | Admitting: Family Medicine

## 2024-02-12 ENCOUNTER — Ambulatory Visit: Admitting: Family Medicine

## 2024-02-12 VITALS — BP 128/80 | HR 91 | Ht 68.0 in | Wt 187.0 lb

## 2024-02-12 DIAGNOSIS — M65311 Trigger thumb, right thumb: Secondary | ICD-10-CM | POA: Diagnosis not present

## 2024-02-12 DIAGNOSIS — H0015 Chalazion left lower eyelid: Secondary | ICD-10-CM | POA: Diagnosis not present

## 2024-02-12 DIAGNOSIS — I10 Essential (primary) hypertension: Secondary | ICD-10-CM

## 2024-02-12 DIAGNOSIS — R21 Rash and other nonspecific skin eruption: Secondary | ICD-10-CM

## 2024-02-12 DIAGNOSIS — Z0000 Encounter for general adult medical examination without abnormal findings: Secondary | ICD-10-CM | POA: Diagnosis not present

## 2024-02-12 DIAGNOSIS — L819 Disorder of pigmentation, unspecified: Secondary | ICD-10-CM

## 2024-02-12 LAB — POCT GLYCOSYLATED HEMOGLOBIN (HGB A1C): Hemoglobin A1C: 5.6 % (ref 4.0–5.6)

## 2024-02-12 NOTE — Patient Instructions (Signed)
 Measure blood pressure morning and evening for next 2 weeks

## 2024-02-12 NOTE — Progress Notes (Signed)
 Name: Aaron Leonard   Date of Visit: 02/12/24   Date of last visit with me: Visit date not found   CHIEF COMPLAINT:  Chief Complaint  Patient presents with   Annual Exam    Cpe. nonfasting. As a stye on left eye, right eye inside feels swollen. Can't bend right thumb, hands cramp up at times. Has spot on face, been there for a while.        HPI:  Discussed the use of AI scribe software for clinical note transcription with the patient, who gave verbal consent to proceed.  History of Present Illness   Aaron Leonard is a 68 year old male who presents for a physical exam and evaluation of left thumb stiffness and a persistent sty on the left eye.  He has experienced stiffness in his left thumb for approximately two months. The thumb lacks flexibility and cannot be bent without assistance, though it does not cause significant pain. 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 While the stiffness does not prevent him from performing his job, it slightly affects his writing. No history of trauma to the thumb is reported.  He has a sty on his left eye that has persisted for two weeks. Despite applying warm compresses and using over-the-counter sty treatment, the sty appears to be reducing in size. The sty appears to be reducing in size, and there is no pain or signs of infection.  He experiences occasional hand cramping, which he attributes to arthritis. The cramping occurs sporadically, often after physical activity, and resolves with continued movement.  He mentions a rash on his face that has been present for some time, which he finds bothersome but has not sought treatment for until now. He describes it as a cosmetic concern.  He has a history of hypertension but reports not taking his medication, losartan , for some time. His recent blood pressure readings have been within normal range, with the last reading at 128/80 mmHg.         OBJECTIVE:        02/12/2024    9:57 AM  Depression screen PHQ 2/9  Decreased Interest 0  Down, Depressed, Hopeless 0  PHQ - 2 Score 0     BP Readings from Last 3 Encounters:  02/12/24 128/80  10/25/23 120/80  10/03/23 124/80    BP 128/80   Pulse 91   Ht 5' 8 (1.727 m)   Wt 187 lb (84.8 kg)   SpO2 98%   BMI 28.43 kg/m    Physical Exam   VITALS: BP- 128/80 HEENT: Small lesion near right eyelid. MUSCULOSKELETAL: Right thumb with limited flexion, no tenderness on palpation.      Physical Exam Constitutional:      Appearance: Normal appearance.  Neurological:     General: No focal deficit present.     Mental Status: He is alert and oriented to person, place, and time. Mental status is at baseline.     Inspection reveals no gross abnormalities of the right thumb.  There is noted inability to flex the thumb unless manually attempted.  No pain noted with forced flexion or extension.  Tenderness over the DIP does note some mild thickening of the pulley.  No significant bone cyst noted.  ASSESSMENT/PLAN:   Assessment & Plan Annual physical exam  Trigger finger of right thumb  Chalazion left lower eyelid  Facial rash  Hypertension, unspecified type  Hyperpigmentation  Assessment and Plan    Trigger thumb, right Two-month history of stiffness in the right thumb, consistent with trigger thumb. Differential includes tendon injury or thickening causing triggering. - Schedule ultrasound of the right thumb. - Order x-rays of both hands. - Plan for possible steroid injection if imaging supports trigger thumb diagnosis. - Follow up in two weeks for further evaluation.  Hand muscle cramps Intermittent hand muscle cramps, likely due to dehydration and electrolyte imbalance. - Encourage hydration and electrolyte intake, such as Gatorade Zero.  Hypertension, under evaluation for discontinuation of therapy Previously on losartan  for hypertension, but has not taken it for some  time. Blood pressure readings have been well-controlled without medication. - Monitor blood pressure morning and evening for two weeks. - Evaluate blood pressure readings at follow-up to determine need for medication.  Chalazion, left lower eyelid Two-week history of chalazion on the left lower eyelid. Warm compresses have been used with some reduction in size. - Continue warm compresses 3-4 times daily for 10-15 minutes. - Clean eyelid with baby shampoo after compresses. - Re-evaluate in two weeks; consider ophthalmology referral if no improvement.  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 No signs of malignancy. - Refer to dermatology for evaluation and management.  Adult Wellness Visit Routine adult wellness visit. Blood pressure measured at 128/80 mmHg. Discussed the importance of hydration and electrolyte balance. - Perform basic blood work. - Monitor blood pressure morning and evening for two weeks. - Encourage hydration and electrolyte intake, such as Gatorade Zero.  General Health Maintenance Discussed flu vaccination, which is not yet available. He is up to date on other vaccinations and screenings. - Advise flu vaccination when available in September.         Amina Menchaca A. Vita MD [facility]

## 2024-02-13 ENCOUNTER — Ambulatory Visit
Admission: RE | Admit: 2024-02-13 | Discharge: 2024-02-13 | Disposition: A | Discharge status: Home / Self Care | Source: Ambulatory Visit | Attending: Family Medicine

## 2024-02-13 ENCOUNTER — Ambulatory Visit: Payer: Self-pay | Admitting: Family Medicine

## 2024-02-13 DIAGNOSIS — M65311 Trigger thumb, right thumb: Secondary | ICD-10-CM

## 2024-02-13 LAB — CBC WITH DIFFERENTIAL/PLATELET
Basophils Absolute: 0 x10E3/uL (ref 0.0–0.2)
Basos: 0 %
EOS (ABSOLUTE): 0.1 x10E3/uL (ref 0.0–0.4)
Eos: 2 %
Hematocrit: 38.8 % (ref 37.5–51.0)
Hemoglobin: 12.6 g/dL — ABNORMAL LOW (ref 13.0–17.7)
Immature Grans (Abs): 0 x10E3/uL (ref 0.0–0.1)
Immature Granulocytes: 0 %
Lymphocytes Absolute: 1.7 x10E3/uL (ref 0.7–3.1)
Lymphs: 22 %
MCH: 29 pg (ref 26.6–33.0)
MCHC: 32.5 g/dL (ref 31.5–35.7)
MCV: 89 fL (ref 79–97)
Monocytes Absolute: 0.8 x10E3/uL (ref 0.1–0.9)
Monocytes: 10 %
Neutrophils Absolute: 5 x10E3/uL (ref 1.4–7.0)
Neutrophils: 66 %
Platelets: 234 x10E3/uL (ref 150–450)
RBC: 4.35 x10E6/uL (ref 4.14–5.80)
RDW: 13.1 % (ref 11.6–15.4)
WBC: 7.6 x10E3/uL (ref 3.4–10.8)

## 2024-02-13 LAB — COMPREHENSIVE METABOLIC PANEL WITH GFR
ALT: 25 IU/L (ref 0–44)
AST: 32 IU/L (ref 0–40)
Albumin: 4.5 g/dL (ref 3.9–4.9)
Alkaline Phosphatase: 80 IU/L (ref 44–121)
BUN/Creatinine Ratio: 20 (ref 10–24)
BUN: 17 mg/dL (ref 8–27)
Bilirubin Total: 0.6 mg/dL (ref 0.0–1.2)
CO2: 20 mmol/L (ref 20–29)
Calcium: 9.8 mg/dL (ref 8.6–10.2)
Chloride: 102 mmol/L (ref 96–106)
Creatinine, Ser: 0.84 mg/dL (ref 0.76–1.27)
Globulin, Total: 3 g/dL (ref 1.5–4.5)
Glucose: 90 mg/dL (ref 70–99)
Potassium: 4.3 mmol/L (ref 3.5–5.2)
Sodium: 137 mmol/L (ref 134–144)
Total Protein: 7.5 g/dL (ref 6.0–8.5)
eGFR: 95 mL/min/1.73 (ref >59)

## 2024-02-13 LAB — PSA: Prostate Specific Ag, Serum: 2.7 ng/mL (ref 0.0–4.0)

## 2024-03-01 ENCOUNTER — Ambulatory Visit: Admitting: Family Medicine

## 2024-03-01 ENCOUNTER — Encounter: Payer: Self-pay | Admitting: Family Medicine

## 2024-03-01 VITALS — BP 140/88 | HR 84 | Wt 188.6 lb

## 2024-03-01 DIAGNOSIS — M180 Bilateral primary osteoarthritis of first carpometacarpal joints: Secondary | ICD-10-CM

## 2024-03-01 DIAGNOSIS — L819 Disorder of pigmentation, unspecified: Secondary | ICD-10-CM

## 2024-03-01 DIAGNOSIS — H0015 Chalazion left lower eyelid: Secondary | ICD-10-CM | POA: Diagnosis not present

## 2024-03-01 DIAGNOSIS — M13842 Other specified arthritis, left hand: Secondary | ICD-10-CM

## 2024-03-01 DIAGNOSIS — I10 Essential (primary) hypertension: Secondary | ICD-10-CM | POA: Diagnosis not present

## 2024-03-01 DIAGNOSIS — M13841 Other specified arthritis, right hand: Secondary | ICD-10-CM | POA: Diagnosis not present

## 2024-03-01 MED ORDER — MELOXICAM 15 MG PO TABS: 15.0000 mg | ORAL_TABLET | Freq: Every day | ORAL | 0 refills | Status: DC

## 2024-03-01 NOTE — Progress Notes (Signed)
 Name: Aaron Leonard   Date of Visit: 03/01/24   Date of last visit with me: 02/12/2024   CHIEF COMPLAINT:  Chief Complaint  Patient presents with   Follow-up    Follow up for thumb. Still has stye on eye.        HPI:  Discussed the use of AI scribe software for clinical note transcription with the patient, who gave verbal consent to proceed.  History of Present Illness   Aaron Leonard is a 68 year old male with arthritis who presents with hand pain and stiffness.  He notes improvement in thumb pain since the last visit, describing it as 'not as sore as it was.' He experiences difficulty bending the thumb but mentions it does not hurt as much when bent. No catching sensation is present when opening the thumb, and he states 'it ain't like the other thumb yet, but it's better than it was.'  He describes occasional stiffness and cramping in his fingers, attributing it to arthritis. The stiffness is temporary, sporadic, and not reproducible, occurring without a clear trigger. His hands are frequently used at work, handling boxes and pallets, but stiffness can occur during simple activities like sweeping. He reports improved ability to write, indicating his thumb is 'more malleable and flexible' than before. No pain reported in the left hand. No need to forcefully open his fingers, and no persistent pain in the hands.  He mentions a persistent stye that has not resolved despite using warm compresses and baby shampoo. It is not tender but feels noticeable, and he has observed a similar issue in the other eye.  He discusses a skin issue on his face that has been present for a while. Notes discoloration with no improvemen.t         OBJECTIVE:       02/12/2024    9:57 AM  Depression screen PHQ 2/9  Decreased Interest 0  Down, Depressed, Hopeless 0  PHQ - 2 Score 0     BP Readings from Last 3 Encounters:  03/01/24 (!) 140/88  02/12/24 128/80  10/25/23 120/80    BP (!) 140/88    Pulse 84   Wt 188 lb 9.6 oz (85.5 kg)   SpO2 98%   BMI 28.68 kg/m    Physical Exam          Physical Exam   ASSESSMENT/PLAN:   Assessment & Plan Arthritis of carpometacarpal (CMC) joint of both thumbs  Allergic arthritis of right hand  Allergic arthritis of left hand  Primary hypertension  Chalazion left lower eyelid  Discoloration of skin of face    Assessment and Plan    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 Symptoms include intermittent pain and stiffness, not severe. - Prescribed meloxicam  as needed for pain and inflammation. - Advised monitoring symptoms and reporting if pain becomes persistent or severe. - Discussed potential future interventions if symptoms worsen, including injections. - Encouraged continued use of hands to maintain range of motion.  Elevated blood pressure Elevated blood pressure noted, requiring monitoring for potential management adjustment. - Instructed to monitor blood pressure at home, morning and evening for one week. - Advised to report if blood pressure remains elevated for potential treatment adjustment.  Chalazion of bilateral eyelids Persistent chalazion on bilateral eyelids, unresponsive to conservative measures, may require surgical intervention. - Referred to ophthalmologist for evaluation and potential surgical removal of chalazion.  Discoloration of  face , unspecified site Unspecified skin lesion requiring further evaluation. - Referred to dermatologist for assessment and management.         Erskin Zinda A. Vita MD [facility]

## 2024-03-08 ENCOUNTER — Ambulatory Visit: Admitting: Family Medicine

## 2024-03-13 NOTE — Telephone Encounter (Signed)
 Copied from CRM [id number]. Topic: Referral - Question >> Mar 12, 2024  5:07 PM Debby BROCKS wrote: Reason for CRM: Referral was placed on patients chart [facility] However, no one has contacted him for an appointment and he doesn't know where to go or what to do with the referral   Best Callback: [phone]

## 2024-03-18 ENCOUNTER — Telehealth: Payer: Self-pay | Admitting: Family Medicine

## 2024-03-18 NOTE — Telephone Encounter (Unsigned)
 Copied from CRM [id number]. Topic: Referral - Question >> Mar 18, 2024  2:31 PM Willma R wrote: Reason for CRM: Patient was seen on 03/01/24 and referred to a Opthalmology for a sty and swelling in his eyes. He spoke to two Ophthalmology specialists who advised he needs to see and Oculoplastic, because they cannot see him until the end of October and then they were advised him to see an Education administrator. Patient is looking for guidance on his next steps.  Patient can be reached at [phone] or [phone]

## 2024-03-18 NOTE — Telephone Encounter (Signed)
 Doesn't appear that anyone else has replied/responded (sent to pool). I reviewed prior note, no documentation of exam. Didn't sound urgent.  Typically one doesn't need to see an oculoplasti specialist for chalazion or styes. He was referred to [facility] eye doctors. Perhaps he can be referred elsewhere more urgently, to get seen sooner. Please check with referral team about this, an re-order if needed.

## 2024-03-20 ENCOUNTER — Telehealth: Payer: Self-pay

## 2024-03-20 NOTE — Telephone Encounter (Signed)
 Copied from CRM #[id number]. Topic: Clinical - Medication Question >> Mar 20, 2024  9:07 AM Myrick T wrote: Reason for CRM: patient called to see if Dr Vita would put him back on losartan -hydrochlorothiazide (HYZAAR) 50-12.5 MG tablet as his pressure has been on the high side for about 3 weeks. Please f/u with patient.

## 2024-03-21 ENCOUNTER — Telehealth: Payer: Self-pay | Admitting: Internal Medicine

## 2024-03-21 NOTE — Telephone Encounter (Signed)
 Please find him find someone that will see him  Copied from CRM #[id number]. Topic: Referral - Status >> Mar 20, 2024  9:10 AM Aaron Leonard wrote: Reason for CRM: patient would like a call back about his referral to the OPHTHALMOLOGIST as everyone he has called say they don'Leonard see patients for Bilateral chalazion. Please f//u with patient to let him know where we are in the search.

## 2024-03-25 MED ORDER — LOSARTAN POTASSIUM-HCTZ 50-12.5 MG PO TABS: 1.0000 | ORAL_TABLET | Freq: Every day | ORAL | 0 refills | Status: DC

## 2024-03-25 NOTE — Telephone Encounter (Signed)
 Got patient scheduled for 10/24.

## 2024-03-25 NOTE — Addendum Note (Signed)
 Addended by: Emon Lance on: 03/25/2024 09:43 AM   Modules accepted: Orders

## 2024-04-19 ENCOUNTER — Ambulatory Visit: Admitting: Family Medicine

## 2024-04-19 ENCOUNTER — Encounter: Payer: Self-pay | Admitting: Family Medicine

## 2024-04-19 VITALS — BP 130/80 | HR 74 | Wt 185.6 lb

## 2024-04-19 DIAGNOSIS — I10 Essential (primary) hypertension: Secondary | ICD-10-CM | POA: Diagnosis not present

## 2024-04-19 DIAGNOSIS — M180 Bilateral primary osteoarthritis of first carpometacarpal joints: Secondary | ICD-10-CM

## 2024-04-19 MED ORDER — MELOXICAM 15 MG PO TABS: 15.0000 mg | ORAL_TABLET | Freq: Every day | ORAL | 0 refills | Status: DC

## 2024-04-19 NOTE — Progress Notes (Signed)
   Name: Aaron Leonard   Date of Visit: 04/19/24   Date of last visit with me: 03/18/2024   CHIEF COMPLAINT:  Chief Complaint  Patient presents with   Follow-up    Follow up for bp. Thumb has gotten better.        HPI:  Discussed the use of AI scribe software for clinical note transcription with the patient, who gave verbal consent to proceed.  History of Present Illness   Aaron Leonard is a 68 year old male with hypertension who presents for blood pressure management and joint pain.  He has been monitoring his blood pressure regularly, noting that readings are generally good but occasionally elevated. He takes losartan , which he finds effective in managing his blood pressure. He typically measures his blood pressure after work, around 7:30 to 8:00 AM, with an average reading of approximately 137/80 mmHg.  He experiences joint pain, which has significantly improved and is nearly back to normal. He can bend the affected joint as he normally could. 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 He uses Voltaren gel for mild pain. He has not received meloxicam  from the pharmacy despite a previous prescription.  He works night shifts, which involve physical labor, potentially impacting his joint health.         OBJECTIVE:       02/12/2024    9:57 AM  Depression screen PHQ 2/9  Decreased Interest 0  Down, Depressed, Hopeless 0  PHQ - 2 Score 0     BP Readings from Last 3 Encounters:  04/19/24 130/80  03/01/24 (!) 140/88  02/12/24 128/80    BP 130/80   Pulse 74   Wt 185 lb 9.6 oz (84.2 kg)   SpO2 97%   BMI 28.22 kg/m    Physical Exam          Physical Exam  ASSESSMENT/PLAN:   Assessment & Plan Arthritis of carpometacarpal (CMC) joint of both thumbs  Hypertension, unspecified type    Assessment and Plan    Hand osteoarthritis Chronic hand osteoarthritis with improving symptoms. 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 - Use  Voltaren gel for mild to moderate flare-ups (pain level 3-4/10). - Refill meloxicam  for severe flare-ups (pain level 7-8/10). - Consider corticosteroid injection if symptoms persist despite medication.  Hypertension-uncontrolled, will likely increase dosage of hyzaar Hypertension managed with losartan . Blood pressure averages 137/80 mmHg, slightly above target of <130/80 mmHg. - Continue losartan . - Periodically measure blood pressure. - Reassess in 3-4 months; consider dosage increase if readings remain >130/80 mmHg.         Nilesh Stegall A. Vita MD [facility]

## 2024-06-21 ENCOUNTER — Other Ambulatory Visit: Payer: Self-pay | Admitting: Family Medicine

## 2024-06-21 MED ORDER — LOSARTAN POTASSIUM-HCTZ 50-12.5 MG PO TABS: 1.0000 | ORAL_TABLET | Freq: Every day | ORAL | 0 refills | Status: DC

## 2024-06-21 NOTE — Telephone Encounter (Signed)
Pt has been out of medication for a week

## 2024-07-19 ENCOUNTER — Ambulatory Visit: Payer: Self-pay | Admitting: Medical

## 2024-07-19 VITALS — BP 124/82 | HR 83 | Wt 185.6 lb

## 2024-07-19 DIAGNOSIS — Z8619 Personal history of other infectious and parasitic diseases: Secondary | ICD-10-CM

## 2024-07-19 DIAGNOSIS — L989 Disorder of the skin and subcutaneous tissue, unspecified: Secondary | ICD-10-CM

## 2024-07-19 DIAGNOSIS — D649 Anemia, unspecified: Secondary | ICD-10-CM

## 2024-07-19 DIAGNOSIS — Z13220 Encounter for screening for lipoid disorders: Secondary | ICD-10-CM | POA: Diagnosis not present

## 2024-07-19 DIAGNOSIS — I10 Essential (primary) hypertension: Secondary | ICD-10-CM

## 2024-07-19 MED ORDER — LOSARTAN POTASSIUM-HCTZ 50-12.5 MG PO TABS: 1.0000 | ORAL_TABLET | Freq: Every day | ORAL | 2 refills | Status: AC

## 2024-07-19 NOTE — Progress Notes (Signed)
 "  Name: Aaron Leonard   Date of Visit: 07/19/24   Date of last visit with me: Visit date not found   CHIEF COMPLAINT:  Chief Complaint  Patient presents with   Follow-up    Follow-up on blood pressure       HPI:  Discussed the use of AI scribe software for clinical note transcription with the patient, who gave verbal consent to proceed.  History of Present Illness   Aaron Leonard is a 69 year old male with hypertension who presents for a follow-up visit.  He saw PCP back in August for well visit.  He has been taking losartan  HCT 50/12.5 mg daily consistently for approximately three months. He has not had a recent urine test for proteinuria or a cholesterol panel this year, with the last one being in 2023.  He was on losartan  HCT in the past but had had periods of time where he was not taking it.  He has noticed 'little spots, blotches' on his face, which he believes may require dermatological evaluation. He has been living with these spots for some time and is interested in exploring options for removal.  He was noted to be slightly anemic in his last lab work in August. No symptoms or known bleeding. He had a colonoscopy in 2024.  No blood in the stool.  Feels fine in general.  He has a history of prior treatment for hepatitis C.   Past Medical History:  Diagnosis Date   Allergy    RHINITIS   Congenital anomalies of the testes    ED (erectile dysfunction)    Hypertension    Medical history non-contributory    enlarged liver   Current Outpatient Medications on File Prior to Visit  Medication Sig Dispense Refill   ibuprofen (ADVIL) 200 MG tablet Take 200 mg by mouth every 6 (six) hours as needed.     naproxen sodium (ALEVE) 220 MG tablet Take 220 mg by mouth daily as needed.     Current Facility-Administered Medications on File Prior to Visit  Medication Dose Route Frequency Provider Last Rate Last Admin   0.9 %  sodium chloride  infusion  500 mL Intravenous Once  Nandigam, Kavitha V, MD        ROS as in subjective    Objective: BP 124/82   Pulse 83   Wt 185 lb 9.6 oz (84.2 kg)   SpO2 98%   BMI 28.22 kg/m   Wt Readings from Last 3 Encounters:  07/19/24 185 lb 9.6 oz (84.2 kg)  04/19/24 185 lb 9.6 oz (84.2 kg)  03/01/24 188 lb 9.6 oz (85.5 kg)   BP Readings from Last 3 Encounters:  07/19/24 124/82  04/19/24 130/80  03/01/24 (!) 140/88     General appearence: alert, no distress, WD/WN,  Neck: supple, no lymphadenopathy, no thyromegaly, no masses Heart: RRR, normal S1, S2, no murmurs Lungs: CTA bilaterally, no wheezes, rhonchi, or rales Extremities: no edema, no cyanosis, no clubbing Pulses: 2+ symmetric, upper and lower extremities, normal cap refill Skin: Bilateral cheeks with a flat brownish macular papular rash approximately 3 to  4 cm x 2 cm on the left and similar but smaller rash on the right cheek   Assessment: Encounter Diagnoses  Name Primary?   Hypertension, unspecified type Yes   Skin lesion of face    Mild anemia    Screening for lipid disorders    History of hepatitis C      Plan:  Essential hypertension Blood pressure controlled with losartan  HCT. Discussed heart disease screening options. - Continue losartan  HCT 50/12.5 mg daily. - Ordered urine test for proteinuria.  Facial skin lesion (possible melasma) Facial lesions possibly melasma or other. Referred for further evaluation. - Referred to dermatology/skin speciality for evaluation and potential laser therapy.  Mild anemia Slightly low hemoglobin, asymptomatic, no bleeding sources. - Ordered iron studies.  Screen for hyperlipidemia - Ordered lipid panel. - Discussed CT coronary scan for heart disease screening.  General health maintenance Routine health maintenance discussed. Next colonoscopy due in 2026. - due to schedule repeat colonoscopy in 2027.  History of hepatitis C with prior treatment -Consider FibroScan of liver going  forward  Brodrick was seen today for follow-up.  Diagnoses and all orders for this visit:  Hypertension, unspecified type -     Lipid panel -     Urinalysis, Routine w reflex microscopic -     Microalbumin/Creatinine Ratio, Urine  Skin lesion of face -     Ambulatory referral to Dermatology  Mild anemia -     Iron, TIBC and Ferritin Panel  Screening for lipid disorders -     Lipid panel  History of hepatitis C  Other orders -     losartan -hydrochlorothiazide (HYZAAR) 50-12.5 MG tablet; Take 1 tablet by mouth daily.    F/u pending labs     "

## 2024-07-20 LAB — IRON,TIBC AND FERRITIN PANEL
Ferritin: 221 ng/mL (ref 30–400)
Iron Saturation: 22 % (ref 15–55)
Iron: 64 ug/dL (ref 38–169)
Total Iron Binding Capacity: 286 ug/dL (ref 250–450)
UIBC: 222 ug/dL (ref 111–343)

## 2024-07-20 LAB — MICROALBUMIN / CREATININE URINE RATIO
Creatinine, Urine: 125 mg/dL
Microalb/Creat Ratio: 4 mg/g{creat} (ref 0–29)
Microalbumin, Urine: 5.6 ug/mL

## 2024-07-20 LAB — URINALYSIS, ROUTINE W REFLEX MICROSCOPIC
Bilirubin, UA: NEGATIVE
Glucose, UA: NEGATIVE
Ketones, UA: NEGATIVE
Leukocytes,UA: NEGATIVE
Nitrite, UA: NEGATIVE
Protein,UA: NEGATIVE
RBC, UA: NEGATIVE
Specific Gravity, UA: 1.025 (ref 1.005–1.030)
Urobilinogen, Ur: 0.2 mg/dL (ref 0.2–1.0)
pH, UA: 5.5 (ref 5.0–7.5)

## 2024-07-20 LAB — LIPID PANEL
Chol/HDL Ratio: 2.8 ratio (ref 0.0–5.0)
Cholesterol, Total: 147 mg/dL (ref 100–199)
HDL: 52 mg/dL
LDL Chol Calc (NIH): 81 mg/dL (ref 0–99)
Triglycerides: 69 mg/dL (ref 0–149)
VLDL Cholesterol Cal: 14 mg/dL (ref 5–40)

## 2024-07-21 ENCOUNTER — Ambulatory Visit: Payer: Self-pay | Admitting: Medical

## 2024-07-21 NOTE — Progress Notes (Signed)
 Iron levels are normal, cholesterol looks good, urine protein and microalbumin marker is normal.  Continue your current medicaiton.  Let us  know if you want to pursue the CT coronary test.

## 2024-07-31 ENCOUNTER — Emergency Department (HOSPITAL_COMMUNITY)
Admission: EM | Admit: 2024-07-31 | Discharge: 2024-08-01 | Disposition: A | Attending: Emergency Medicine | Admitting: Emergency Medicine

## 2024-07-31 ENCOUNTER — Other Ambulatory Visit: Payer: Self-pay

## 2024-07-31 ENCOUNTER — Emergency Department (HOSPITAL_COMMUNITY)

## 2024-07-31 ENCOUNTER — Encounter (HOSPITAL_COMMUNITY): Payer: Self-pay | Admitting: Emergency Medicine

## 2024-07-31 DIAGNOSIS — R5383 Other fatigue: Secondary | ICD-10-CM | POA: Insufficient documentation

## 2024-07-31 DIAGNOSIS — R55 Syncope and collapse: Secondary | ICD-10-CM | POA: Insufficient documentation

## 2024-07-31 DIAGNOSIS — R8289 Other abnormal findings on cytological and histological examination of urine: Secondary | ICD-10-CM | POA: Insufficient documentation

## 2024-07-31 DIAGNOSIS — R944 Abnormal results of kidney function studies: Secondary | ICD-10-CM | POA: Insufficient documentation

## 2024-07-31 DIAGNOSIS — R197 Diarrhea, unspecified: Secondary | ICD-10-CM | POA: Insufficient documentation

## 2024-07-31 DIAGNOSIS — R059 Cough, unspecified: Secondary | ICD-10-CM | POA: Insufficient documentation

## 2024-07-31 DIAGNOSIS — Z79899 Other long term (current) drug therapy: Secondary | ICD-10-CM | POA: Insufficient documentation

## 2024-07-31 DIAGNOSIS — I10 Essential (primary) hypertension: Secondary | ICD-10-CM | POA: Insufficient documentation

## 2024-07-31 LAB — URINALYSIS, W/ REFLEX TO CULTURE (INFECTION SUSPECTED)
Bacteria, UA: NONE SEEN
Bilirubin Urine: NEGATIVE
Glucose, UA: NEGATIVE mg/dL
Ketones, ur: NEGATIVE mg/dL
Leukocytes,Ua: NEGATIVE
Nitrite: NEGATIVE
Protein, ur: NEGATIVE mg/dL
Specific Gravity, Urine: 1.019 (ref 1.005–1.030)
pH: 5 (ref 5.0–8.0)

## 2024-07-31 LAB — COMPREHENSIVE METABOLIC PANEL WITH GFR
ALT: 21 U/L (ref 0–44)
AST: 28 U/L (ref 15–41)
Albumin: 4.3 g/dL (ref 3.5–5.0)
Alkaline Phosphatase: 69 U/L (ref 38–126)
Anion gap: 11 (ref 5–15)
BUN: 27 mg/dL — ABNORMAL HIGH (ref 8–23)
CO2: 24 mmol/L (ref 22–32)
Calcium: 9.9 mg/dL (ref 8.9–10.3)
Chloride: 100 mmol/L (ref 98–111)
Creatinine, Ser: 1.34 mg/dL — ABNORMAL HIGH (ref 0.61–1.24)
GFR, Estimated: 57 mL/min — ABNORMAL LOW
Glucose, Bld: 132 mg/dL — ABNORMAL HIGH (ref 70–99)
Potassium: 4.1 mmol/L (ref 3.5–5.1)
Sodium: 135 mmol/L (ref 135–145)
Total Bilirubin: 0.6 mg/dL (ref 0.0–1.2)
Total Protein: 7.9 g/dL (ref 6.5–8.1)

## 2024-07-31 LAB — CBC
HCT: 41.7 % (ref 39.0–52.0)
Hemoglobin: 13.7 g/dL (ref 13.0–17.0)
MCH: 29.2 pg (ref 26.0–34.0)
MCHC: 32.9 g/dL (ref 30.0–36.0)
MCV: 88.9 fL (ref 80.0–100.0)
Platelets: 226 10*3/uL (ref 150–400)
RBC: 4.69 MIL/uL (ref 4.22–5.81)
RDW: 13.3 % (ref 11.5–15.5)
WBC: 9.5 10*3/uL (ref 4.0–10.5)
nRBC: 0 % (ref 0.0–0.2)

## 2024-07-31 LAB — MAGNESIUM: Magnesium: 2 mg/dL (ref 1.7–2.4)

## 2024-07-31 LAB — CBG MONITORING, ED: Glucose-Capillary: 109 mg/dL — ABNORMAL HIGH (ref 70–99)

## 2024-07-31 LAB — I-STAT CG4 LACTIC ACID, ED: Lactic Acid, Venous: 1.4 mmol/L (ref 0.5–1.9)

## 2024-07-31 LAB — TSH: TSH: 2.55 u[IU]/mL (ref 0.350–4.500)

## 2024-07-31 LAB — LIPASE, BLOOD: Lipase: 17 U/L (ref 11–51)

## 2024-07-31 MED ORDER — SODIUM CHLORIDE 0.9 % IV BOLUS
1000.0000 mL | Freq: Once | INTRAVENOUS | Status: AC
  Administered 2024-07-31: 1000 mL via INTRAVENOUS

## 2024-07-31 NOTE — ED Provider Notes (Signed)
 " [place] [facility] AT [facility] Provider Note   CSN: [id number] Arrival date & time: 07/31/24  2146     Patient presents with: Loss of Consciousness   Aaron Leonard is a 69 y.o. male.  Patient with past medical history significant for hypertension, chronic hepatitis C presents to the emergency department via EMS after a possible syncopal episode.  Patient was reportedly at work and began to feel bad.  He states he went to the bathroom had an episode of diarrhea.  He had a sudden onset headache in late down with his head on the table in the break room.  Coworker on scene reported the patient began to projectile vomit and then passed out.  EMS reports that upon their arrival patient was bradycardic with a heart rate of 32, was diaphoretic and lethargic.  He was administered 500 mL of lactated Ringer's.  Upon arrival patient had a heart rate of 71 and a blood pressure of 111/77.  Patient does not remember vomiting.  He states he does remember going to the bathroom and having the episode of diarrhea.  He states he has also had a mild cough for the past few days.  He reports feeling lightheaded and fatigued for the past day.  He denies any urinary incontinence and denies any mouth injury.  Currently the patient reports some lingering lightheadedness but denies nausea, abdominal pain, chest pain, shortness of breath.    Loss of Consciousness      Prior to Admission medications  Medication Sig Start Date End Date Taking? Authorizing Provider  ibuprofen (ADVIL) 200 MG tablet Take 200 mg by mouth every 6 (six) hours as needed.    [provider]  losartan -hydrochlorothiazide (HYZAAR) 50-12.5 MG tablet Take 1 tablet by mouth daily. 07/19/24   Tysinger, Alm RAMAN, PA-C  naproxen sodium (ALEVE) 220 MG tablet Take 220 mg by mouth daily as needed.    [provider]    Allergies: Patient has no known allergies.    Review of Systems  Cardiovascular:  Positive  for syncope.    Updated Vital Signs BP 125/88 (BP Location: Right Arm)   Pulse 69   Temp 98.2 F (36.8 C) (Oral)   Resp 20   Ht 5' 9 (1.753 m)   Wt 84.4 kg   SpO2 92%   BMI 27.47 kg/m   Physical Exam Vitals and nursing note reviewed.  Constitutional:      General: He is not in acute distress.    Appearance: He is well-developed.  HENT:     Head: Normocephalic and atraumatic.  Eyes:     Conjunctiva/sclera: Conjunctivae normal.  Cardiovascular:     Rate and Rhythm: Normal rate and regular rhythm.  Pulmonary:     Effort: Pulmonary effort is normal. No respiratory distress.     Breath sounds: Normal breath sounds.  Abdominal:     Palpations: Abdomen is soft.     Tenderness: There is no abdominal tenderness.  Musculoskeletal:        General: No swelling.     Cervical back: Neck supple.  Skin:    General: Skin is warm and dry.     Capillary Refill: Capillary refill takes less than 2 seconds.  Neurological:     Mental Status: He is alert.  Psychiatric:        Mood and Affect: Mood normal.     (all labs ordered are listed, but only abnormal results are displayed) Labs Reviewed  COMPREHENSIVE  METABOLIC PANEL WITH GFR - Abnormal; Notable for the following components:      Result Value   Glucose, Bld 132 (*)    BUN 27 (*)    Creatinine, Ser 1.34 (*)    GFR, Estimated 57 (*)    All other components within normal limits  URINALYSIS, W/ REFLEX TO CULTURE (INFECTION SUSPECTED) - Abnormal; Notable for the following components:   APPearance HAZY (*)    Hgb urine dipstick MODERATE (*)    All other components within normal limits  CBG MONITORING, ED - Abnormal; Notable for the following components:   Glucose-Capillary 109 (*)    All other components within normal limits  CBC  MAGNESIUM  TSH  LIPASE, BLOOD  I-STAT CG4 LACTIC ACID, ED    EKG: None  Radiology: CT Head Wo Contrast Result Date: 08/01/2024 EXAM: CT HEAD WITHOUT CONTRAST 08/01/2024 12:20:50 AM  TECHNIQUE: CT of the head was performed without the administration of intravenous contrast. Automated exposure control, iterative reconstruction, and/or weight based adjustment of the mA/kV was utilized to reduce the radiation dose to as low as reasonably achievable. COMPARISON: None available. CLINICAL HISTORY: Mental status change, unknown cause; headache; syncope; bradycardia; nausea/vomiting. FINDINGS: BRAIN AND VENTRICLES: No acute hemorrhage. No evidence of acute infarct. No hydrocephalus. No extra-axial collection. No mass effect or midline shift. ORBITS: No acute abnormality. SINUSES: Scattered mucosal thickening in paranasal sinuses. SOFT TISSUES AND SKULL: No acute soft tissue abnormality. No skull fracture. IMPRESSION: 1. No acute intracranial abnormality. Electronically signed by: Franky Stanford MD 08/01/2024 12:25 AM EST RP Workstation: [id number]   DG Chest 2 View Result Date: 07/31/2024 EXAM: 2 VIEW(S) XRAY OF THE CHEST 07/31/2024 10:44:00 PM COMPARISON: None available. CLINICAL HISTORY: Bradycardia, syncope, cough; rule out pneumonia or other. FINDINGS: LUNGS AND PLEURA: Elevated right hemidiaphragm. No focal pulmonary opacity. No pleural effusion. No pneumothorax. HEART AND MEDIASTINUM: No acute abnormality of the cardiac and mediastinal silhouettes. BONES AND SOFT TISSUES: Degenerative changes in spine. IMPRESSION: 1. No acute cardiopulmonary findings. 2. Elevated right hemidiaphragm. Electronically signed by: Norman Gatlin MD 07/31/2024 10:52 PM EST RP Workstation: [id number]     Procedures   Medications Ordered in the ED  sodium chloride  0.9 % bolus 1,000 mL (0 mLs Intravenous Stopped 08/01/24 0112)                                    Medical Decision Making Amount and/or Complexity of Data Reviewed Labs: ordered.   This patient presents to the ED for concern of syncope, this involves an extensive number of treatment options, and is a complaint that carries with it a high risk of  complications and morbidity.  The differential diagnosis includes orthostatic changes/dehydration, vasovagal response, dysrhythmia, intracranial abnormality, infection, others   Co morbidities / Chronic conditions that complicate the patient evaluation  As noted in HPI   Additional history obtained:  Additional history obtained from EMR   Lab Tests:  I Ordered, and personally interpreted labs.  The pertinent results include: Mildly elevated creatinine at 1.34, UA with moderate hemoglobin   Imaging Studies ordered:  I ordered imaging studies including chest x-ray, CT head without contrast I independently visualized and interpreted imaging which showed no acute findings on chest x-ray, CT shows: I agree with the radiologist interpretation   Cardiac Monitoring: / EKG:  The patient was maintained on a cardiac monitor.  I personally viewed and interpreted the cardiac monitored which  showed an underlying rhythm of: Sinus rhythm   Problem List / ED Course / Critical interventions / Medication management   I ordered medication including saline bolus Reevaluation of the patient after these medicines showed that the patient improved I have reviewed the patients home medicines and have made adjustments as needed  Patient with reassuring workup overall. Test / Admission - Considered:  Grossly unremarkable EKG.  CT scan showing no intracranial abnormality.  No signs of infection on workup.  Suspect dehydration versus vasovagal syncope at this time.  Patient treated with saline bolus.  Orthostatic vitals were checked after fluid administration and were negative for significant change.  At this time patient is able to tolerate oral intake and able to ambulate without difficulty.  Patient appears stable for discharge home.  Return precautions provided.      Final diagnoses:  Syncope, unspecified syncope type    ED Discharge Orders     None          Aaron Leonard 08/01/24 [id number]    Midge Golas, MD 08/01/24 [phone]  "

## 2024-07-31 NOTE — ED Provider Notes (Incomplete)
 " [place] [facility] AT [facility] Provider Note   CSN: [id number] Arrival date & time: 07/31/24  2146     Patient presents with: Loss of Consciousness   Aaron Leonard is a 69 y.o. male.  Patient with past medical history significant for hypertension, chronic hepatitis C presents to the emergency department via EMS after a possible syncopal episode.  Patient was reportedly at work and began to feel bad.  He states he went to the bathroom had an episode of diarrhea.  He had a sudden onset headache in late down with his head on the table in the break room.  Coworker on scene reported the patient began to projectile vomit and then passed out.  EMS reports that upon their arrival patient was bradycardic with a heart rate of 32, was diaphoretic and lethargic.  He was administered 500 mL of lactated Ringer's.  Upon arrival patient had a heart rate of 71 and a blood pressure of 111/77.  Patient does not remember vomiting.  He states he does remember going to the bathroom and having the episode of diarrhea.  He states he has also had a mild cough for the past few days.  He reports feeling lightheaded and fatigued for the past day.  He denies any urinary incontinence and denies any mouth injury.  Currently the patient reports some lingering lightheadedness but denies nausea, abdominal pain, chest pain, shortness of breath.  {Add pertinent medical, surgical, social history, OB history to HPI:32947}  Loss of Consciousness      Prior to Admission medications  Medication Sig Start Date End Date Taking? Authorizing Provider  ibuprofen (ADVIL) 200 MG tablet Take 200 mg by mouth every 6 (six) hours as needed.    [provider]  losartan -hydrochlorothiazide (HYZAAR) 50-12.5 MG tablet Take 1 tablet by mouth daily. 07/19/24   Tysinger, Alm RAMAN, PA-C  naproxen sodium (ALEVE) 220 MG tablet Take 220 mg by mouth daily as needed.    [provider]    Allergies: Patient  has no known allergies.    Review of Systems  Cardiovascular:  Positive for syncope.    Updated Vital Signs BP 125/88 (BP Location: Right Arm)   Pulse 69   Temp 98.2 F (36.8 C) (Oral)   Resp 20   Ht 5' 9 (1.753 m)   Wt 84.4 kg   SpO2 92%   BMI 27.47 kg/m   Physical Exam Vitals and nursing note reviewed.  Constitutional:      General: He is not in acute distress.    Appearance: He is well-developed.  HENT:     Head: Normocephalic and atraumatic.  Eyes:     Conjunctiva/sclera: Conjunctivae normal.  Cardiovascular:     Rate and Rhythm: Normal rate and regular rhythm.  Pulmonary:     Effort: Pulmonary effort is normal. No respiratory distress.     Breath sounds: Normal breath sounds.  Abdominal:     Palpations: Abdomen is soft.     Tenderness: There is no abdominal tenderness.  Musculoskeletal:        General: No swelling.     Cervical back: Neck supple.  Skin:    General: Skin is warm and dry.     Capillary Refill: Capillary refill takes less than 2 seconds.  Neurological:     Mental Status: He is alert.  Psychiatric:        Mood and Affect: Mood normal.     (all labs ordered are listed, but  only abnormal results are displayed) Labs Reviewed  COMPREHENSIVE METABOLIC PANEL WITH GFR - Abnormal; Notable for the following components:      Result Value   Glucose, Bld 132 (*)    BUN 27 (*)    Creatinine, Ser 1.34 (*)    GFR, Estimated 57 (*)    All other components within normal limits  CBG MONITORING, ED - Abnormal; Notable for the following components:   Glucose-Capillary 109 (*)    All other components within normal limits  CBC  MAGNESIUM  TSH  LIPASE, BLOOD  URINALYSIS, W/ REFLEX TO CULTURE (INFECTION SUSPECTED)  I-STAT CG4 LACTIC ACID, ED    EKG: None  Radiology: DG Chest 2 View Result Date: 07/31/2024 EXAM: 2 VIEW(S) XRAY OF THE CHEST 07/31/2024 10:44:00 PM COMPARISON: None available. CLINICAL HISTORY: Bradycardia, syncope, cough; rule out  pneumonia or other. FINDINGS: LUNGS AND PLEURA: Elevated right hemidiaphragm. No focal pulmonary opacity. No pleural effusion. No pneumothorax. HEART AND MEDIASTINUM: No acute abnormality of the cardiac and mediastinal silhouettes. BONES AND SOFT TISSUES: Degenerative changes in spine. IMPRESSION: 1. No acute cardiopulmonary findings. 2. Elevated right hemidiaphragm. Electronically signed by: Norman Gatlin MD 07/31/2024 10:52 PM EST RP Workstation: [id number]    {Document cardiac monitor, telemetry assessment procedure when appropriate:32947} Procedures   Medications Ordered in the ED  sodium chloride  0.9 % bolus 1,000 mL (has no administration in time range)      {Click here for ABCD2, HEART and other calculators REFRESH Note before signing:1}                              Medical Decision Making Amount and/or Complexity of Data Reviewed Labs: ordered.   This patient presents to the ED for concern of syncope, this involves an extensive number of treatment options, and is a complaint that carries with it a high risk of complications and morbidity.  The differential diagnosis includes orthostatic changes/dehydration, vasovagal response, dysrhythmia, intracranial abnormality, infection, others   Co morbidities / Chronic conditions that complicate the patient evaluation  As noted in HPI   Additional history obtained:  Additional history obtained from EMR   Lab Tests:  I Ordered, and personally interpreted labs.  The pertinent results include: Mildly elevated creatinine at 1.34, UA with moderate hemoglobin   Imaging Studies ordered:  I ordered imaging studies including ***  I independently visualized and interpreted imaging which showed *** I agree with the radiologist interpretation   Cardiac Monitoring: / EKG:  The patient was maintained on a cardiac monitor.  I personally viewed and interpreted the cardiac monitored which showed an underlying rhythm of: ***   Problem  List / ED Course / Critical interventions / Medication management  *** I ordered medication including ***   Reevaluation of the patient after these medicines showed that the patient *** I have reviewed the patients home medicines and have made adjustments as needed   Consultations Obtained:  I requested consultation with the ***,  and discussed lab and imaging findings as well as pertinent plan - they recommend: ***   Social Determinants of Health:  ***   Test / Admission - Considered:  ***   {Document critical care time when appropriate  Document review of labs and clinical decision tools ie CHADS2VASC2, etc  Document your independent review of radiology images and any outside records  Document your discussion with family members, caretakers and with consultants  Document social determinants of health affecting  pt's care  Document your decision making why or why not admission, treatments were needed:32947:::1}   Final diagnoses:  None    ED Discharge Orders     None        "

## 2024-07-31 NOTE — ED Provider Triage Note (Signed)
 Emergency Medicine Provider Triage Evaluation Note  Aaron Leonard , a 69 y.o. male  was evaluated in triage.  Pt complains of syncope/near syncope.  Review of Systems  Positive: Headache, nausea, vomiting, syncope, fatigue, lightheadedness, recent cough, 1 episode of diarrhea today Negative: Neck pain, neck stiffness, fevers, chills, constipation, urinary changes, trauma  Physical Exam  BP 111/77 (BP Location: Right Arm)   Pulse 71   Temp 98.7 F (37.1 C) (Oral)   Resp 20   Ht 5' 9 (1.753 m)   Wt 84.4 kg   SpO2 95%   BMI 27.47 kg/m  Gen:   Awake, no distress   Resp:  Normal effort  MSK:   Moves extremities without difficulty  Other:    Medical Decision Making  Medically screening exam initiated at 10:05 PM.  Appropriate orders placed.  Aaron Leonard was informed that the remainder of the evaluation will be completed by another provider, this initial triage assessment does not replace that evaluation, and the importance of remaining in the ED until their evaluation is complete.  Aaron Leonard is a 69 y.o. male with a past medical history significant for hypertension who presents with syncope, bradycardia, headache, nausea, vomiting, 1 episode of a loose stool, fatigue, lightheadedness, and cough.  According to patient, he has had a mild cough for the last few days and did not feel well yesterday.  He said he just felt lightheaded and fatigued.  He took some medicine and felt like he was doing better.  He was able to go to work today but started feeling worse.  He reports he started having more lightheadedness that hit him and he had to lay down.  He does not remember passing out but he was told he syncopized and subsequently was having some headache.  He also had nausea and vomiting he does not remember and EMS said his heart rate was in the 30s and he was diaphoretic and ill-appearing.  They gave him some fluids and his heart rate and blood pressure has improved.  Glucose was  reassuring per EMS.  Patient said he did have 1 loose bowel movement today but otherwise has not any abdominal pain.  He reports some cough but denies any chest pain or palpitations.  He denies any history of slow heart rates in the past and he does not take a beta-blocker.  He denies any trauma.  He is not reporting headache now but he had some earlier.  He does not report sick contacts to me.  He denies any constipation or urinary changes.  Denies any extremity pains.  Given the patient's reported bradycardia in the 30s and syncope/near syncope, he will get a cardiac workup.  With his vomiting unresponsive episode with syncope and headache in the setting of prior hypertension, will get a CT of the head.  Get some screening labs.  Will get a chest x-ray given his cough and will check in for COVID//RSV.  Will get other labs as well.  Anticipate reassessment after workup to determine disposition and after he is seen by provider in a room.         Tyrell Seifer, Lonni PARAS, MD 07/31/24 2227

## 2024-07-31 NOTE — ED Triage Notes (Signed)
 Pt BIB [facility] from work at [employer]. Pt was at work, started feeling bad, went to bathroom and had episode of diarrhea. Then had sudden onset of headache and went to break room to sit down, per coworker pt started projectile vomiting and then passed out. On EMS arrival pt HR was 32, diaphoretic and lethargic; pt given 500 LR, HR now 60s, BP 110/76, 92% on room air. CBG 151

## 2024-07-31 NOTE — ED Notes (Signed)
 Patient transported to X-ray

## 2024-08-01 ENCOUNTER — Emergency Department (HOSPITAL_COMMUNITY)

## 2024-08-01 NOTE — ED Provider Notes (Signed)
 ED ECG REPORT   Date: 07/31/2024 1021pm  Rate: 76  Rhythm: normal sinus rhythm  QRS Axis: normal  Intervals: normal  ST/T Wave abnormalities: normal  Conduction Disutrbances:none  Narrative Interpretation:   Old EKG Reviewed: unchanged from previous  I have personally reviewed the EKG tracing and agree with the computerized printout as noted.   On my exam, pt reports feeling improved No arm/leg drift No cardiac murmurs No ABD tenderness Workup pending at this time   Midge Golas, MD 08/01/24 0030

## 2024-08-01 NOTE — ED Notes (Signed)
 Pt ambulated without assistance. Pt denies any dizziness. Steady gait.

## 2024-08-01 NOTE — Discharge Instructions (Addendum)
 Your workup this evening was reassuring. Please be sure to hydrate while at work. I recommend following up with your primary care provider. Return to the emergency department if you develop any life threatening symptoms.

## 2025-02-14 ENCOUNTER — Encounter: Payer: Self-pay | Admitting: Family Medicine

## 2025-02-26 ENCOUNTER — Encounter: Admitting: Family Medicine
